# Patient Record
Sex: Male | Born: 1990 | Race: White | Hispanic: No | Marital: Married | Smoking: Never smoker
Health system: Southern US, Community
[De-identification: ages and names within clinical notes are randomized; demographics above are authoritative.]

## PROBLEM LIST (undated history)

## (undated) DIAGNOSIS — R519 Headache, unspecified: Secondary | ICD-10-CM

## (undated) DIAGNOSIS — Z789 Other specified health status: Secondary | ICD-10-CM

## (undated) DIAGNOSIS — F419 Anxiety disorder, unspecified: Secondary | ICD-10-CM

## (undated) DIAGNOSIS — F32A Depression, unspecified: Secondary | ICD-10-CM

## (undated) HISTORY — PX: NO PAST SURGERIES: SHX2092

---

## 2015-03-23 DIAGNOSIS — IMO0002 Reserved for concepts with insufficient information to code with codable children: Secondary | ICD-10-CM | POA: Insufficient documentation

## 2015-03-23 DIAGNOSIS — F5104 Psychophysiologic insomnia: Secondary | ICD-10-CM | POA: Insufficient documentation

## 2015-03-23 DIAGNOSIS — Z8659 Personal history of other mental and behavioral disorders: Secondary | ICD-10-CM | POA: Insufficient documentation

## 2019-09-16 DIAGNOSIS — F332 Major depressive disorder, recurrent severe without psychotic features: Secondary | ICD-10-CM | POA: Insufficient documentation

## 2019-09-16 DIAGNOSIS — F411 Generalized anxiety disorder: Secondary | ICD-10-CM | POA: Insufficient documentation

## 2019-10-27 DIAGNOSIS — E559 Vitamin D deficiency, unspecified: Secondary | ICD-10-CM | POA: Insufficient documentation

## 2019-12-25 DIAGNOSIS — F4001 Agoraphobia with panic disorder: Secondary | ICD-10-CM | POA: Diagnosis not present

## 2019-12-25 DIAGNOSIS — F5104 Psychophysiologic insomnia: Secondary | ICD-10-CM | POA: Diagnosis not present

## 2019-12-25 DIAGNOSIS — F411 Generalized anxiety disorder: Secondary | ICD-10-CM | POA: Diagnosis not present

## 2019-12-25 DIAGNOSIS — F332 Major depressive disorder, recurrent severe without psychotic features: Secondary | ICD-10-CM | POA: Diagnosis not present

## 2019-12-29 DIAGNOSIS — E559 Vitamin D deficiency, unspecified: Secondary | ICD-10-CM | POA: Diagnosis not present

## 2021-05-29 DIAGNOSIS — F5104 Psychophysiologic insomnia: Secondary | ICD-10-CM | POA: Diagnosis not present

## 2021-05-29 DIAGNOSIS — F411 Generalized anxiety disorder: Secondary | ICD-10-CM | POA: Diagnosis not present

## 2021-05-29 DIAGNOSIS — F3341 Major depressive disorder, recurrent, in partial remission: Secondary | ICD-10-CM | POA: Diagnosis not present

## 2021-11-24 ENCOUNTER — Ambulatory Visit: Payer: BC Managed Care – PPO | Admitting: Family Medicine

## 2021-11-24 VITALS — BP 127/86 | HR 82 | Temp 98.1°F | Ht 71.0 in | Wt 171.0 lb

## 2021-11-24 DIAGNOSIS — F332 Major depressive disorder, recurrent severe without psychotic features: Secondary | ICD-10-CM

## 2021-11-24 DIAGNOSIS — F411 Generalized anxiety disorder: Secondary | ICD-10-CM

## 2021-11-24 DIAGNOSIS — Z130 Encounter for screening for diseases of the blood and blood-forming organs and certain disorders involving the immune mechanism: Secondary | ICD-10-CM | POA: Diagnosis not present

## 2021-11-24 DIAGNOSIS — Z13220 Encounter for screening for lipoid disorders: Secondary | ICD-10-CM

## 2021-11-24 MED ORDER — BUSPIRONE HCL 15 MG PO TABS: 15.0000 mg | ORAL_TABLET | Freq: Two times a day (BID) | ORAL | 1 refills | Status: DC

## 2021-11-24 MED ORDER — DULOXETINE HCL 60 MG PO CPEP
120.0000 mg | ORAL_CAPSULE | Freq: Every day | ORAL | 1 refills | Status: DC
Start: 2021-11-24 — End: 2022-06-01

## 2021-11-24 NOTE — Progress Notes (Signed)
? ?Subjective:  ?Patient ID: Charles Key, male    DOB: Apr 03, 1991  Age: 31 y.o. MRN: [id number] ? ?CC: Establish care ? ?HPI ? ?31 year old male with a history of anxiety, depression, insomnia, and hypercalcemia presents to establish care. ? ?Patient states that he is on Cymbalta and BuSpar for anxiety and depression.  He states that he is doing well at this time.  Needs refills. ? ?Patient has a history of hypercalcemia and vitamin D deficiency.  Hypercalcemia thought to be secondary to hyperparathyroidism.  He has not had follow-up since 2021. ? ?He has no other complaints or concerns at this time. ? ? ?Patient Active Problem List  ? Diagnosis Date Noted  ? Hypercalcemia 12/29/2019  ? Vitamin D deficiency 10/27/2019  ? Generalized anxiety disorder 09/16/2019  ? Severe episode of recurrent major depressive disorder, without psychotic features ([place]) 09/16/2019  ? History of suicidal ideation 03/23/2015  ? History of intentional self-harm 03/23/2015  ? Psychophysiological insomnia 03/23/2015  ? ?Review of Systems  ?Constitutional: Negative.   ?Respiratory: Negative.    ?Cardiovascular: Negative.   ? ?Objective:  ? ?Today's Vitals: BP 127/86   Pulse 82   Temp 98.1 ?F (36.7 ?C) (Oral)   Ht 5' 11"  (1.803 m)   Wt 171 lb (77.6 kg)   SpO2 99%   BMI 23.85 kg/m?  ? ?Physical Exam ?Vitals and nursing note reviewed.  ?Constitutional:   ?   General: He is not in acute distress. ?   Appearance: Normal appearance.  ?HENT:  ?   Head: Normocephalic and atraumatic.  ?Eyes:  ?   General:     ?   Right eye: No discharge.     ?   Left eye: No discharge.  ?   Conjunctiva/sclera: Conjunctivae normal.  ?Cardiovascular:  ?   Rate and Rhythm: Normal rate and regular rhythm.  ?   Heart sounds: No murmur heard. ?Pulmonary:  ?   Effort: Pulmonary effort is normal.  ?   Breath sounds: No wheezing, rhonchi or rales.  ?Abdominal:  ?   General: There is no distension.  ?   Palpations: Abdomen is soft.  ?   Tenderness: There is no  abdominal tenderness.  ?Neurological:  ?   Mental Status: He is alert.  ?Psychiatric:     ?   Mood and Affect: Mood normal.     ?   Behavior: Behavior normal.  ? ? ? ?Assessment & Plan:  ? ?Problem List Items Addressed This Visit   ? ?  ? Other  ? Generalized anxiety disorder  ?  Stable at this time.  Continue BuSpar. ?  ?  ? Relevant Medications  ? DULoxetine (CYMBALTA) 60 MG capsule  ? busPIRone (BUSPAR) 15 MG tablet  ? Hypercalcemia  ?  Unsure of current status.  Labs today.  May need to see general surgery regarding removal of parathyroid glands if he indeed has hyperparathyroidism. ?  ?  ? Relevant Orders  ? CMP14+EGFR  ? Parathyroid hormone, intact (no Ca)  ? Vitamin D (25 hydroxy)  ? Severe episode of recurrent major depressive disorder, without psychotic features ([place])  ?  Stable at this time.  Continue Cymbalta. ?  ?  ? Relevant Medications  ? DULoxetine (CYMBALTA) 60 MG capsule  ? busPIRone (BUSPAR) 15 MG tablet  ? ?Other Visit Diagnoses   ? ? Screening for deficiency anemia    -  Primary  ? Relevant Orders  ? CBC  ? Screening, lipid      ?  Relevant Orders  ? Lipid panel  ? ?  ? ? ?Meds ordered this encounter  ?Medications  ? DULoxetine (CYMBALTA) 60 MG capsule  ?  Sig: Take 2 capsules (120 mg total) by mouth daily.  ?  Dispense:  180 capsule  ?  Refill:  1  ? busPIRone (BUSPAR) 15 MG tablet  ?  Sig: Take 1 tablet (15 mg total) by mouth 2 (two) times daily.  ?  Dispense:  180 tablet  ?  Refill:  1  ? ? ?Follow-up: Return in about 6 months (around 05/26/2022). ? ?Thersa Salt DO ?[place] ? ? ? ? ?

## 2021-11-24 NOTE — Patient Instructions (Signed)
Labs today. ? ?I have refilled your medications. ? ?Follow-up in 6 months. ?

## 2021-11-24 NOTE — Assessment & Plan Note (Signed)
Unsure of current status.  Labs today.  May need to see general surgery regarding removal of parathyroid glands if he indeed has hyperparathyroidism. ?

## 2021-11-24 NOTE — Assessment & Plan Note (Signed)
Stable at this time.  Continue Cymbalta. ?

## 2021-11-24 NOTE — Assessment & Plan Note (Signed)
Stable at this time.  Continue BuSpar. ?

## 2021-11-25 LAB — CBC
Hematocrit: 48.1 % (ref 37.5–51.0)
Hemoglobin: 15.8 g/dL (ref 13.0–17.7)
MCH: 29.5 pg (ref 26.6–33.0)
MCHC: 32.8 g/dL (ref 31.5–35.7)
MCV: 90 fL (ref 79–97)
Platelets: 244 10*3/uL (ref 150–450)
RBC: 5.35 x10E6/uL (ref 4.14–5.80)
RDW: 12.7 % (ref 11.6–15.4)
WBC: 7.8 10*3/uL (ref 3.4–10.8)

## 2021-11-25 LAB — VITAMIN D 25 HYDROXY (VIT D DEFICIENCY, FRACTURES): Vit D, 25-Hydroxy: 16.9 ng/mL — ABNORMAL LOW (ref 30.0–100.0)

## 2021-11-25 LAB — CMP14+EGFR
ALT: 69 IU/L — ABNORMAL HIGH (ref 0–44)
AST: 28 IU/L (ref 0–40)
Albumin/Globulin Ratio: 1.8 (ref 1.2–2.2)
Albumin: 4.7 g/dL (ref 4.1–5.2)
Alkaline Phosphatase: 78 IU/L (ref 44–121)
BUN/Creatinine Ratio: 21 — ABNORMAL HIGH (ref 9–20)
BUN: 21 mg/dL — ABNORMAL HIGH (ref 6–20)
Bilirubin Total: 0.2 mg/dL (ref 0.0–1.2)
CO2: 27 mmol/L (ref 20–29)
Calcium: 12.2 mg/dL — ABNORMAL HIGH (ref 8.7–10.2)
Chloride: 109 mmol/L — ABNORMAL HIGH (ref 96–106)
Creatinine, Ser: 1 mg/dL (ref 0.76–1.27)
Globulin, Total: 2.6 g/dL (ref 1.5–4.5)
Glucose: 74 mg/dL (ref 70–99)
Potassium: 4.5 mmol/L (ref 3.5–5.2)
Sodium: 149 mmol/L — ABNORMAL HIGH (ref 134–144)
Total Protein: 7.3 g/dL (ref 6.0–8.5)
eGFR: 104 mL/min/{1.73_m2} (ref >59)

## 2021-11-25 LAB — LIPID PANEL
Chol/HDL Ratio: 5.9 ratio — ABNORMAL HIGH (ref 0.0–5.0)
Cholesterol, Total: 282 mg/dL — ABNORMAL HIGH (ref 100–199)
HDL: 48 mg/dL (ref >39)
LDL Chol Calc (NIH): 204 mg/dL — ABNORMAL HIGH (ref 0–99)
Triglycerides: 160 mg/dL — ABNORMAL HIGH (ref 0–149)
VLDL Cholesterol Cal: 30 mg/dL (ref 5–40)

## 2021-11-25 LAB — PARATHYROID HORMONE, INTACT (NO CA): PTH: 89 pg/mL — ABNORMAL HIGH (ref 15–65)

## 2021-11-28 ENCOUNTER — Other Ambulatory Visit: Payer: Self-pay | Admitting: Family Medicine

## 2021-11-28 ENCOUNTER — Encounter: Payer: Self-pay | Admitting: Family Medicine

## 2021-11-28 MED ORDER — ROSUVASTATIN CALCIUM 20 MG PO TABS: 20.0000 mg | ORAL_TABLET | Freq: Every day | ORAL | 3 refills | Status: DC

## 2021-11-29 ENCOUNTER — Other Ambulatory Visit: Payer: Self-pay | Admitting: Family Medicine

## 2021-11-29 DIAGNOSIS — E213 Hyperparathyroidism, unspecified: Secondary | ICD-10-CM

## 2022-01-10 DIAGNOSIS — E559 Vitamin D deficiency, unspecified: Secondary | ICD-10-CM | POA: Diagnosis not present

## 2022-01-10 DIAGNOSIS — E210 Primary hyperparathyroidism: Secondary | ICD-10-CM | POA: Diagnosis not present

## 2022-01-16 ENCOUNTER — Other Ambulatory Visit: Payer: Self-pay | Admitting: Surgery

## 2022-01-16 ENCOUNTER — Other Ambulatory Visit (HOSPITAL_COMMUNITY): Payer: Self-pay | Admitting: Surgery

## 2022-01-17 ENCOUNTER — Other Ambulatory Visit (HOSPITAL_COMMUNITY): Payer: Self-pay | Admitting: Surgery

## 2022-01-17 ENCOUNTER — Other Ambulatory Visit: Payer: Self-pay | Admitting: Surgery

## 2022-01-17 DIAGNOSIS — E210 Primary hyperparathyroidism: Secondary | ICD-10-CM

## 2022-01-26 ENCOUNTER — Encounter (HOSPITAL_COMMUNITY)
Admission: RE | Admit: 2022-01-26 | Discharge: 2022-01-26 | Disposition: A | Discharge status: Home / Self Care | Payer: PRIVATE HEALTH INSURANCE | Source: Ambulatory Visit | Attending: Surgery | Admitting: Surgery

## 2022-01-26 DIAGNOSIS — E210 Primary hyperparathyroidism: Secondary | ICD-10-CM

## 2022-01-26 MED ORDER — TECHNETIUM TC 99M SESTAMIBI GENERIC - CARDIOLITE
25.1000 | Freq: Once | INTRAVENOUS | Status: AC | PRN
  Administered 2022-01-26: 25.1 via INTRAVENOUS

## 2022-01-29 NOTE — Progress Notes (Signed)
USN does NOT show an adenoma.  Sestamibi scan is questionable for a left superior gland.  Will schedule a 4D-CT scan with parathyroid protocol as we discussed in the office.  Claiborne Billings - please arrange.  [place], MD [facility] A [place] practice Office: [phone]

## 2022-02-06 ENCOUNTER — Other Ambulatory Visit: Payer: Self-pay | Admitting: Surgery

## 2022-02-06 DIAGNOSIS — E0590 Thyrotoxicosis, unspecified without thyrotoxic crisis or storm: Secondary | ICD-10-CM

## 2022-03-07 ENCOUNTER — Ambulatory Visit
Admission: RE | Admit: 2022-03-07 | Discharge: 2022-03-07 | Disposition: A | Discharge status: Home / Self Care | Payer: Self-pay | Source: Ambulatory Visit | Attending: Surgery | Admitting: Surgery

## 2022-03-07 DIAGNOSIS — E0590 Thyrotoxicosis, unspecified without thyrotoxic crisis or storm: Secondary | ICD-10-CM

## 2022-03-07 MED ORDER — IOPAMIDOL (ISOVUE-370) INJECTION 76%
75.0000 mL | Freq: Once | INTRAVENOUS | Status: AC | PRN
  Administered 2022-03-07: 75 mL via INTRAVENOUS

## 2022-03-14 ENCOUNTER — Ambulatory Visit: Payer: Self-pay | Admitting: Surgery

## 2022-03-14 NOTE — Progress Notes (Signed)
Telephone call to patient with results of USN, sestamibi, and CT scan.  Plan to proceed with neck exploration and parathyroidectomy.  Suspect left superior adenoma, but will check all four glands.  Anticipate outpatient surgery.  Armandina Gemma, MD [facility] Surgery A [place] practice Office: [phone]

## 2022-03-28 NOTE — Progress Notes (Addendum)
COVID Vaccine received:  '[]'$  No '[x]'$  Yes Date of any COVID positive Test in last 90 days: none  PCP - Thersa Salt, DO Cardiologist - none  Chest x-ray - n/a EKG -  n/a Stress Test - n/a ECHO - n/a Cardiac Cath - n/a  Pacemaker/ICD device     '[x]'$  N/A Spinal Cord Stimulator:'[x]'$  No '[]'$  Yes   Other Implants:   History of Sleep Apnea? '[x]'$  No '[]'$  Yes   Sleep Study Date:   CPAP used?- '[x]'$  No '[]'$  Yes  (Instruct to bring their mask & Tubing)  Does the patient monitor blood sugar? '[]'$  No '[]'$  Yes  '[x]'$  N/A  Blood Thinner Instructions: none Aspirin Instructions:None Last Dose:  ERAS Protocol Ordered: '[]'$  No  '[x]'$  Yes  no Drink Ordered  Comments: patient did not need any preop labs or testing.   Activity level: Patient can climb a flight of stairs without difficulty;  '[x]'$  No CP  '[x]'$  No SOB,  but would have ______   Anesthesia review: No pertinent medical or surgical history. Never had anesthesia.   Patient denies shortness of breath, fever, cough and chest pain at PAT appointment.  Patient verbalized understanding and agreement to the Pre-Surgical Instructions that were given to them at this PAT appointment. Patient was also educated of the need to review these PAT instructions again prior to his/her surgery.I reviewed the appropriate phone numbers to call if they have any and questions or concerns.

## 2022-03-28 NOTE — Patient Instructions (Addendum)
DUE TO SPACE LIMITATIONS, ONLY TWO VISITORS  (aged 31 and older) ARE ALLOWED TO COME WITH YOU AND STAY IN THE WAITING ROOM DURING YOUR PRE OP AND PROCEDURE.   **NO VISITORS ARE ALLOWED IN THE SHORT STAY AREA OR RECOVERY ROOM!!**  You are not required to quarantine at this time prior to your surgery. However, you must do this: Hand Hygiene often Do NOT share personal items Notify your provider if you are in close contact with someone who has COVID or you develop fever 100.4 or greater, new onset of sneezing, cough, sore throat, shortness of breath or body aches.       Your procedure is scheduled on:  Monday April 02, 2022  Report to [facility] Main Entrance.  Report to admitting at: 05:15 AM  +++++Call this number if you have any questions or problems the morning of surgery [phone]  Do not eat food :After Midnight the night prior to your surgery/procedure.  After Midnight you may have the following liquids until   04:30 AM DAY OF SURGERY  Clear Liquid Diet Water Black Coffee (sugar ok, NO MILK/CREAM OR CREAMERS)  Tea (sugar ok, NO MILK/CREAM OR CREAMERS) regular and decaf                             Plain Jell-O (NO RED)                                           Fruit ices (not with fruit pulp, NO RED)                                     Popsicles (NO RED)                                                                  Juice: apple, WHITE grape, WHITE cranberry Sports drinks like Gatorade (NO RED)             FOLLOW ANY ADDITIONAL PRE OP INSTRUCTIONS YOU RECEIVED FROM YOUR SURGEON'S OFFICE!!!   Oral Hygiene is also important to reduce your risk of infection.        Remember - BRUSH YOUR TEETH THE MORNING OF SURGERY WITH YOUR REGULAR TOOTHPASTE  Take ONLY these medicines the morning of surgery with A SIP OF WATER: buspirone (Buspar), Duloxetine (Cymbalta)                    You may not have any metal on your body including pins, jewelry, and body piercing  Do  not wear lotions, powders, cologne, or deodorant  Men may shave face and neck.   DO NOT [place]. PHARMACY WILL DISPENSE MEDICATIONS LISTED ON YOUR MEDICATION LIST TO YOU DURING YOUR ADMISSION [place]!   Patients discharged on the day of surgery will not be allowed to drive home.  Someone NEEDS to stay with you for the first 24 hours after anesthesia.  Special Instructions: Bring a copy of your healthcare power of attorney  and living will documents the day of surgery, if you wish to have them scanned into your [place] Medical Records- EPIC  Please read over the following fact sheets you were given: IF YOU HAVE QUESTIONS ABOUT YOUR PRE-OP INSTRUCTIONS, PLEASE CALL [phone]  ([place])   [place] - Preparing for Surgery Before surgery, you can play an important role.  Because skin is not sterile, your skin needs to be as free of germs as possible.  You can reduce the number of germs on your skin by washing with CHG (chlorahexidine gluconate) soap before surgery.  CHG is an antiseptic cleaner which kills germs and bonds with the skin to continue killing germs even after washing. Please DO NOT use if you have an allergy to CHG or antibacterial soaps.  If your skin becomes reddened/irritated stop using the CHG and inform your nurse when you arrive at Short Stay. Do not shave (including legs and underarms) for at least 48 hours prior to the first CHG shower.  You may shave your face/neck.  Please follow these instructions carefully:  1.  Shower with CHG Soap the night before surgery and the  morning of surgery.  2.  If you choose to wash your hair, wash your hair first as usual with your normal  shampoo.  3.  After you shampoo, rinse your hair and body thoroughly to remove the shampoo.                             4.  Use CHG as you would any other liquid soap.  You can apply chg directly to the skin and wash.  Gently with a scrungie or clean  washcloth.  5.  Apply the CHG Soap to your body ONLY FROM THE NECK DOWN.   Do not use on face/ open                           Wound or open sores. Avoid contact with eyes, ears mouth and genitals (private parts).                       Wash face,  Genitals (private parts) with your normal soap.             6.  Wash thoroughly, paying special attention to the area where your  surgery  will be performed.  7.  Thoroughly rinse your body with warm water from the neck down.  8.  DO NOT shower/wash with your normal soap after using and rinsing off the CHG Soap.            9.  Pat yourself dry with a clean towel.            10.  Wear clean pajamas.            11.  Place clean sheets on your bed the night of your first shower and do not  sleep with pets.  ON THE DAY OF SURGERY : Do not apply any lotions/deodorants the morning of surgery.  Please wear clean clothes to the hospital/surgery center.    FAILURE TO FOLLOW THESE INSTRUCTIONS MAY RESULT IN THE CANCELLATION OF YOUR SURGERY  PATIENT SIGNATURE_________________________________  NURSE SIGNATURE__________________________________  ________________________________________________________________________

## 2022-03-30 ENCOUNTER — Encounter (HOSPITAL_COMMUNITY): Payer: Self-pay

## 2022-03-30 ENCOUNTER — Other Ambulatory Visit: Payer: Self-pay

## 2022-03-30 ENCOUNTER — Encounter (HOSPITAL_COMMUNITY)
Admission: RE | Admit: 2022-03-30 | Discharge: 2022-03-30 | Disposition: A | Discharge status: Home / Self Care | Payer: 59 | Source: Ambulatory Visit | Attending: Surgery | Admitting: Surgery

## 2022-03-30 DIAGNOSIS — Z01818 Encounter for other preprocedural examination: Secondary | ICD-10-CM | POA: Insufficient documentation

## 2022-03-30 HISTORY — DX: Depression, unspecified: F32.A

## 2022-03-30 HISTORY — DX: Anxiety disorder, unspecified: F41.9

## 2022-03-30 HISTORY — DX: Headache, unspecified: R51.9

## 2022-03-30 HISTORY — DX: Other specified health status: Z78.9

## 2022-04-01 ENCOUNTER — Encounter (HOSPITAL_COMMUNITY): Payer: Self-pay | Admitting: Surgery

## 2022-04-01 DIAGNOSIS — E210 Primary hyperparathyroidism: Secondary | ICD-10-CM | POA: Diagnosis present

## 2022-04-01 NOTE — H&P (Signed)
REFERRING PHYSICIAN: Coral Spikes, DO  PROVIDER: Jatorian Renault Charlotta Newton, MD   Chief Complaint: New Consultation (hyperparathyroidism)  History of Present Illness:  Patient is referred by Dr. Thersa Salt for surgical evaluation and management of suspected primary hyperparathyroidism. Patient has had a history of hypercalcemia. This was initially evaluated by a physician in [place], [place]. Patient has changed primary care providers and is now seeing Dr. Lacinda Axon. He has not had any imaging studies performed. Recent laboratory studies show an elevated serum calcium level of 12.2. Intact PTH level is elevated at 89. 25 hydroxy vitamin D level is low at 16.9. Patient has not had a 24-hour urine collection for calcium. Patient denies any significant complications. He does have some bone and joint discomfort. He has some low back pain. He denies nephrolithiasis. He denies fatigue. Patient has had no prior head or neck surgery. There is no family history of parathyroid disease although the patient does think that one of his sisters has had some problems, possibly with her thyroid. None of his family has required surgery. Patient works in Copy at [employer].  Review of Systems: A complete review of systems was obtained from the patient. I have reviewed this information and discussed as appropriate with the patient. See HPI as well for other ROS.  Review of Systems  Constitutional: Negative.  HENT: Negative.  Eyes: Negative.  Respiratory: Negative.  Cardiovascular: Negative.  Gastrointestinal: Negative.  Genitourinary: Negative.  Musculoskeletal: Positive for back pain and joint pain.  Skin: Negative.  Neurological: Negative.  Endo/Heme/Allergies: Negative.  Psychiatric/Behavioral: Negative.    Medical History: Past Medical History:  Diagnosis Date   Anxiety   Hyperlipidemia   Patient Active Problem List  Diagnosis   Primary hyperparathyroidism (CMS-HCC)   Vitamin D  deficiency   History reviewed. No pertinent surgical history.   No Known Allergies  Current Outpatient Medications on File Prior to Visit  Medication Sig Dispense Refill   busPIRone (BUSPAR) 15 MG tablet Take 15 mg by mouth 2 (two) times daily   DULoxetine (CYMBALTA) 60 MG DR capsule   rosuvastatin (CRESTOR) 20 MG tablet Take 20 mg by mouth once daily   No current facility-administered medications on file prior to visit.   Family History  Problem Relation Age of Onset   High blood pressure (Hypertension) Mother   Stroke Mother   Hyperlipidemia (Elevated cholesterol) Father   Diabetes Father    Social History   Tobacco Use  Smoking Status Never  Smokeless Tobacco Never    Social History   Socioeconomic History   Marital status: Married  Tobacco Use   Smoking status: Never   Smokeless tobacco: Never  Substance and Sexual Activity   Alcohol use: Never   Drug use: Never   Objective:   Vitals:   BP: 136/80  Pulse: 87  Temp: 36.3 C (97.3 F)  SpO2: 98%  Weight: 79.5 kg (175 lb 3.2 oz)  Height: 177.8 cm ('5\' 10"'$ )   Body mass index is 25.14 kg/m.  Physical Exam   GENERAL APPEARANCE Comfortable, no acute issues Development: normal Gross deformities: none  SKIN Rash, lesions, ulcers: none Induration, erythema: none Nodules: none palpable  EYES Conjunctiva and lids: normal Pupils: equal and reactive  EARS, NOSE, MOUTH, THROAT External ears: no lesion or deformity External nose: no lesion or deformity Hearing: grossly normal  NECK Symmetric: yes Trachea: midline Thyroid: no palpable nodules in the thyroid bed  CHEST Respiratory effort: normal Retraction or accessory  muscle use: no Breath sounds: normal bilaterally Rales, rhonchi, wheeze: none  CARDIOVASCULAR Auscultation: regular rhythm, normal rate Murmurs: none Pulses: radial pulse 2+ palpable Lower extremity edema: none  ABDOMEN Not assessed  GENITOURINARY Not  assessed  MUSCULOSKELETAL Station and gait: normal Digits and nails: no clubbing or cyanosis Muscle strength: grossly normal all extremities Range of motion: grossly normal all extremities Deformity: none  LYMPHATIC Cervical: none palpable Supraclavicular: none palpable  PSYCHIATRIC Oriented to person, place, and time: yes Mood and affect: normal for situation Judgment and insight: appropriate for situation   Assessment and Plan:   Primary hyperparathyroidism (CMS-HCC)  Vitamin D deficiency  Patient is referred by his primary care physician for surgical evaluation and management of suspected primary hyperparathyroidism.  Patient provided with a copy of "Parathyroid Surgery: Treatment for Your Parathyroid Gland Problem", published by [employer], 12 pages. Book reviewed and explained to patient during visit today.  The patient has biochemical evidence of primary hyperparathyroidism. I would like to proceed with imaging studies to include an ultrasound examination of the neck as well as a nuclear medicine parathyroid scan. We will also obtain a 24-hour urine collection for calcium. Hopefully the studies will localize a parathyroid adenoma and make the patient a good candidate for minimally invasive surgery. If the studies failed to confirm the location of a parathyroid adenoma, then we will proceed with a 4D CT scan of the neck in hopes of localizing the adenoma.  Today we discussed minimally invasive parathyroid surgery. We discussed the size and location of the surgical incision. We discussed performing this as an outpatient surgical procedure. We discussed time out of work. I provided the patient with written literature on parathyroid surgery to review at home.  Patient will undergo the above imaging studies as well as a 24-hour urine collection for calcium. We will contact him with the results when they are available. We will make plans for further management once the results of his  studies are available.  Armandina Gemma, MD [facility] A [place] practice Office: [phone]

## 2022-04-02 ENCOUNTER — Ambulatory Visit (HOSPITAL_BASED_OUTPATIENT_CLINIC_OR_DEPARTMENT_OTHER): Payer: 59 | Admitting: Anesthesiology

## 2022-04-02 ENCOUNTER — Other Ambulatory Visit: Payer: Self-pay

## 2022-04-02 ENCOUNTER — Ambulatory Visit (HOSPITAL_COMMUNITY)
Admission: RE | Admit: 2022-04-02 | Discharge: 2022-04-02 | Disposition: A | Discharge status: Home / Self Care | Payer: 59 | Source: Ambulatory Visit | Attending: Surgery | Admitting: Surgery

## 2022-04-02 ENCOUNTER — Encounter (HOSPITAL_COMMUNITY)
Admission: RE | Disposition: A | Discharge status: Home / Self Care | Payer: Self-pay | Source: Ambulatory Visit | Attending: Surgery

## 2022-04-02 ENCOUNTER — Encounter (HOSPITAL_COMMUNITY): Payer: Self-pay | Admitting: Surgery

## 2022-04-02 ENCOUNTER — Ambulatory Visit (HOSPITAL_COMMUNITY): Payer: 59 | Admitting: Anesthesiology

## 2022-04-02 DIAGNOSIS — F418 Other specified anxiety disorders: Secondary | ICD-10-CM | POA: Insufficient documentation

## 2022-04-02 DIAGNOSIS — E210 Primary hyperparathyroidism: Secondary | ICD-10-CM

## 2022-04-02 DIAGNOSIS — E0590 Thyrotoxicosis, unspecified without thyrotoxic crisis or storm: Secondary | ICD-10-CM | POA: Insufficient documentation

## 2022-04-02 DIAGNOSIS — D351 Benign neoplasm of parathyroid gland: Secondary | ICD-10-CM | POA: Insufficient documentation

## 2022-04-02 HISTORY — PX: PARATHYROIDECTOMY: SHX19

## 2022-04-02 SURGERY — PARATHYROIDECTOMY
Anesthesia: General

## 2022-04-02 MED ORDER — CHLORHEXIDINE GLUCONATE 0.12 % MT SOLN
15.0000 mL | Freq: Once | OROMUCOSAL | Status: AC
  Administered 2022-04-02: 15 mL via OROMUCOSAL

## 2022-04-02 MED ORDER — TRAMADOL HCL 50 MG PO TABS: 50.0000 mg | ORAL_TABLET | Freq: Four times a day (QID) | ORAL | 0 refills | Status: DC | PRN

## 2022-04-02 MED ORDER — DIPHENHYDRAMINE HCL 50 MG/ML IJ SOLN
INTRAMUSCULAR | Status: AC
  Filled 2022-04-02: qty 1

## 2022-04-02 MED ORDER — MIDAZOLAM HCL 5 MG/5ML IJ SOLN
INTRAMUSCULAR | Status: DC | PRN
  Administered 2022-04-02: 2 mg via INTRAVENOUS

## 2022-04-02 MED ORDER — DEXAMETHASONE SODIUM PHOSPHATE 10 MG/ML IJ SOLN
INTRAMUSCULAR | Status: DC | PRN
  Administered 2022-04-02: 8 mg via INTRAVENOUS

## 2022-04-02 MED ORDER — PROPOFOL 10 MG/ML IV BOLUS
INTRAVENOUS | Status: DC | PRN
  Administered 2022-04-02: 150 mg via INTRAVENOUS
  Administered 2022-04-02: 50 mg via INTRAVENOUS

## 2022-04-02 MED ORDER — FENTANYL CITRATE (PF) 100 MCG/2ML IJ SOLN
INTRAMUSCULAR | Status: DC | PRN
  Administered 2022-04-02: 50 ug via INTRAVENOUS
  Administered 2022-04-02 (×2): 100 ug via INTRAVENOUS

## 2022-04-02 MED ORDER — ACETAMINOPHEN 325 MG PO TABS: 325.0000 mg | ORAL_TABLET | ORAL | Status: DC | PRN

## 2022-04-02 MED ORDER — PROPOFOL 10 MG/ML IV BOLUS
INTRAVENOUS | Status: AC
  Filled 2022-04-02: qty 20

## 2022-04-02 MED ORDER — ACETAMINOPHEN 160 MG/5ML PO SOLN: 325.0000 mg | ORAL | Status: DC | PRN

## 2022-04-02 MED ORDER — MIDAZOLAM HCL 2 MG/2ML IJ SOLN
INTRAMUSCULAR | Status: AC
  Filled 2022-04-02: qty 2

## 2022-04-02 MED ORDER — CHLORHEXIDINE GLUCONATE CLOTH 2 % EX PADS: 6.0000 | MEDICATED_PAD | Freq: Once | CUTANEOUS | Status: DC

## 2022-04-02 MED ORDER — AMISULPRIDE (ANTIEMETIC) 5 MG/2ML IV SOLN: 10.0000 mg | Freq: Once | INTRAVENOUS | Status: DC | PRN

## 2022-04-02 MED ORDER — 0.9 % SODIUM CHLORIDE (POUR BTL) OPTIME
TOPICAL | Status: DC | PRN
  Administered 2022-04-02: 1000 mL

## 2022-04-02 MED ORDER — BUPIVACAINE HCL (PF) 0.5 % IJ SOLN
INTRAMUSCULAR | Status: AC
  Filled 2022-04-02: qty 30

## 2022-04-02 MED ORDER — DEXAMETHASONE SODIUM PHOSPHATE 10 MG/ML IJ SOLN
INTRAMUSCULAR | Status: AC
  Filled 2022-04-02: qty 1

## 2022-04-02 MED ORDER — ONDANSETRON HCL 4 MG/2ML IJ SOLN
INTRAMUSCULAR | Status: AC
  Filled 2022-04-02: qty 2

## 2022-04-02 MED ORDER — DIPHENHYDRAMINE HCL 50 MG/ML IJ SOLN
INTRAMUSCULAR | Status: DC | PRN
  Administered 2022-04-02: 6.25 mg via INTRAVENOUS

## 2022-04-02 MED ORDER — FENTANYL CITRATE PF 50 MCG/ML IJ SOSY: 25.0000 ug | PREFILLED_SYRINGE | INTRAMUSCULAR | Status: DC | PRN

## 2022-04-02 MED ORDER — ROCURONIUM BROMIDE 100 MG/10ML IV SOLN
INTRAVENOUS | Status: DC | PRN
  Administered 2022-04-02: 60 mg via INTRAVENOUS

## 2022-04-02 MED ORDER — DEXMEDETOMIDINE (PRECEDEX) IN NS 20 MCG/5ML (4 MCG/ML) IV SYRINGE
PREFILLED_SYRINGE | INTRAVENOUS | Status: DC | PRN
  Administered 2022-04-02: 20 ug via INTRAVENOUS

## 2022-04-02 MED ORDER — ORAL CARE MOUTH RINSE: 15.0000 mL | Freq: Once | OROMUCOSAL | Status: AC

## 2022-04-02 MED ORDER — ROCURONIUM BROMIDE 10 MG/ML (PF) SYRINGE
PREFILLED_SYRINGE | INTRAVENOUS | Status: AC
  Filled 2022-04-02: qty 10

## 2022-04-02 MED ORDER — CEFAZOLIN SODIUM-DEXTROSE 2-4 GM/100ML-% IV SOLN
2.0000 g | INTRAVENOUS | Status: AC
  Administered 2022-04-02: 2 g via INTRAVENOUS
  Filled 2022-04-02: qty 100

## 2022-04-02 MED ORDER — ONDANSETRON HCL 4 MG/2ML IJ SOLN
INTRAMUSCULAR | Status: DC | PRN
  Administered 2022-04-02: 4 mg via INTRAVENOUS

## 2022-04-02 MED ORDER — BUPIVACAINE HCL 0.5 % IJ SOLN
INTRAMUSCULAR | Status: DC | PRN
  Administered 2022-04-02: 10 mL

## 2022-04-02 MED ORDER — ACETAMINOPHEN 10 MG/ML IV SOLN: 1000.0000 mg | Freq: Once | INTRAVENOUS | Status: DC | PRN

## 2022-04-02 MED ORDER — OXYCODONE HCL 5 MG PO TABS
ORAL_TABLET | ORAL | Status: AC
  Filled 2022-04-02: qty 1

## 2022-04-02 MED ORDER — OXYCODONE HCL 5 MG/5ML PO SOLN: 5.0000 mg | Freq: Once | ORAL | Status: DC | PRN

## 2022-04-02 MED ORDER — LIDOCAINE 2% (20 MG/ML) 5 ML SYRINGE
INTRAMUSCULAR | Status: AC
  Filled 2022-04-02: qty 5

## 2022-04-02 MED ORDER — OXYCODONE HCL 5 MG PO TABS: 5.0000 mg | ORAL_TABLET | Freq: Once | ORAL | Status: DC | PRN

## 2022-04-02 MED ORDER — HEMOSTATIC AGENTS (NO CHARGE) OPTIME
TOPICAL | Status: DC | PRN
  Administered 2022-04-02: 1

## 2022-04-02 MED ORDER — KETAMINE HCL 10 MG/ML IJ SOLN
INTRAMUSCULAR | Status: AC
Start: 2022-04-02 — End: ?
  Filled 2022-04-02: qty 1

## 2022-04-02 MED ORDER — FENTANYL CITRATE (PF) 250 MCG/5ML IJ SOLN
INTRAMUSCULAR | Status: AC
  Filled 2022-04-02: qty 5

## 2022-04-02 MED ORDER — PROMETHAZINE HCL 25 MG/ML IJ SOLN: 6.2500 mg | INTRAMUSCULAR | Status: DC | PRN

## 2022-04-02 MED ORDER — SUGAMMADEX SODIUM 500 MG/5ML IV SOLN
INTRAVENOUS | Status: DC | PRN
  Administered 2022-04-02: 200 mg via INTRAVENOUS

## 2022-04-02 MED ORDER — KETAMINE HCL 10 MG/ML IJ SOLN
INTRAMUSCULAR | Status: DC | PRN
  Administered 2022-04-02: 30 mg via INTRAVENOUS

## 2022-04-02 MED ORDER — LACTATED RINGERS IV SOLN: INTRAVENOUS | Status: DC

## 2022-04-02 MED ORDER — SODIUM CHLORIDE (PF) 0.9 % IJ SOLN
INTRAMUSCULAR | Status: AC
  Filled 2022-04-02: qty 10

## 2022-04-02 MED ORDER — LIDOCAINE HCL (CARDIAC) PF 100 MG/5ML IV SOSY
PREFILLED_SYRINGE | INTRAVENOUS | Status: DC | PRN
  Administered 2022-04-02: 60 mg via INTRAVENOUS

## 2022-04-02 MED ORDER — FENTANYL CITRATE PF 50 MCG/ML IJ SOSY
PREFILLED_SYRINGE | INTRAMUSCULAR | Status: AC
  Administered 2022-04-02: 50 ug via INTRAVENOUS
  Filled 2022-04-02: qty 2

## 2022-04-02 SURGICAL SUPPLY — 33 items
ATTRACTOMAT 16X20 MAGNETIC DRP (DRAPES) ×1 IMPLANT
BAG COUNTER SPONGE SURGICOUNT (BAG) ×1 IMPLANT
BLADE SURG 15 STRL LF DISP TIS (BLADE) ×1 IMPLANT
BLADE SURG 15 STRL SS (BLADE) ×1
CHLORAPREP W/TINT 26 (MISCELLANEOUS) ×1 IMPLANT
CLIP TI MEDIUM 6 (CLIP) ×2 IMPLANT
CLIP TI WIDE RED SMALL 6 (CLIP) ×2 IMPLANT
COVER SURGICAL LIGHT HANDLE (MISCELLANEOUS) ×1 IMPLANT
DERMABOND IMPLANT
DERMABOND ADVANCED (GAUZE/BANDAGES/DRESSINGS) ×1
DERMABOND ADVANCED .7 DNX12 (GAUZE/BANDAGES/DRESSINGS) ×1 IMPLANT
DRAPE LAPAROTOMY T 98X78 PEDS (DRAPES) ×1 IMPLANT
DRAPE UTILITY XL STRL (DRAPES) ×1 IMPLANT
ELECT REM PT RETURN 15FT ADLT (MISCELLANEOUS) ×1 IMPLANT
GAUZE 4X4 16PLY ~~LOC~~+RFID DBL (SPONGE) ×1 IMPLANT
GLOVE SURG ORTHO 8.0 STRL STRW (GLOVE) ×1 IMPLANT
GOWN STRL REUS W/ TWL XL LVL3 (GOWN DISPOSABLE) ×3 IMPLANT
GOWN STRL REUS W/TWL XL LVL3 (GOWN DISPOSABLE) ×4
HEMOSTAT SURGICEL 2X4 FIBR (HEMOSTASIS) ×1 IMPLANT
ILLUMINATOR WAVEGUIDE N/F (MISCELLANEOUS) IMPLANT
KIT BASIN OR (CUSTOM PROCEDURE TRAY) ×1 IMPLANT
KIT TURNOVER KIT A (KITS) IMPLANT
NDL HYPO 25X1 1.5 SAFETY (NEEDLE) ×1 IMPLANT
NEEDLE HYPO 25X1 1.5 SAFETY (NEEDLE) ×1 IMPLANT
PACK BASIC VI WITH GOWN DISP (CUSTOM PROCEDURE TRAY) ×1 IMPLANT
PENCIL SMOKE EVACUATOR (MISCELLANEOUS) ×1 IMPLANT
SUT MNCRL AB 4-0 PS2 18 (SUTURE) ×1 IMPLANT
SUT VIC AB 3-0 SH 18 (SUTURE) ×1 IMPLANT
SYR BULB IRRIG 60ML STRL (SYRINGE) ×1 IMPLANT
SYR CONTROL 10ML LL (SYRINGE) ×1 IMPLANT
TOWEL OR 17X26 10 PK STRL BLUE (TOWEL DISPOSABLE) ×1 IMPLANT
TOWEL OR NON WOVEN STRL DISP B (DISPOSABLE) ×1 IMPLANT
TUBING CONNECTING 10 (TUBING) ×1 IMPLANT

## 2022-04-02 NOTE — Interval H&P Note (Signed)
History and Physical Interval Note:  04/02/2022 7:02 AM  Charles Key  has presented today for surgery, with the diagnosis of PRIMARY HYPERTHYROIDISM.  The various methods of treatment have been discussed with the patient and family. After consideration of risks, benefits and other options for treatment, the patient has consented to    Procedure(s): NECK EXPLORATION WITH PARATHYROIDECTOMY (N/A) as a surgical intervention.    The patient's history has been reviewed, patient examined, no change in status, stable for surgery.  I have reviewed the patient's chart and labs.  Questions were answered to the patient's satisfaction.    Armandina Gemma, [place] [facility] A [place] practice Office:  [place]

## 2022-04-02 NOTE — Anesthesia Procedure Notes (Signed)
Procedure Name: Intubation Date/Time: 04/02/2022 7:34 AM  Performed by: Jonna Munro, CRNAPre-anesthesia Checklist: Patient identified, Emergency Drugs available, Suction available, Patient being monitored and Timeout performed Patient Re-evaluated:Patient Re-evaluated prior to induction Oxygen Delivery Method: Circle system utilized Induction Type: IV induction Ventilation: Mask ventilation without difficulty Laryngoscope Size: Mac and 3 Grade View: Grade I Tube type: Oral Tube size: 7.5 mm Number of attempts: 1 Airway Equipment and Method: Stylet Placement Confirmation: ETT inserted through vocal cords under direct vision, positive ETCO2, CO2 detector and breath sounds checked- equal and bilateral Secured at: 22 cm Tube secured with: Tape Dental Injury: Teeth and Oropharynx as per pre-operative assessment

## 2022-04-02 NOTE — Discharge Instructions (Addendum)
[facility] [place] [facility] - Dr. Todd Gerkin  THYROID & PARATHYROID SURGERY:  POST-OP INSTRUCTIONS  Always review the instruction sheet provided by the hospital nurse at discharge.  A prescription for pain medication may be sent to your pharmacy at the time of discharge.  Take your pain medication as prescribed.  If narcotic pain medicine is not needed, then you may take acetaminophen (Tylenol) or ibuprofen (Advil) as needed for pain or soreness.  Take your normal home medications as prescribed unless otherwise directed.  If you need a refill on your pain medication, please contact the office during regular business hours.  Prescriptions will not be processed by the office after 5:00PM or on weekends.  Start with a light diet upon arrival home, such as soup and crackers or toast.  Be sure to drink plenty of fluids.  Resume your normal diet the day after surgery.  Most patients will experience some swelling and bruising on the chest and neck area.  Ice packs will help for the first 48 hours after arriving home.  Swelling and bruising will take several days to resolve.   It is common to experience some constipation after surgery.  Increasing fluid intake and taking a stool softener (Colace) will usually help to prevent this problem.  A mild laxative (Milk of Magnesia or Miralax) should be taken according to package directions if there has been no bowel movement after 48 hours.  Dermabond glue covers your incision. This seals the wound and you may shower at any time. The Dermabond will remain in place for about a week.  You may gradually remove the glue when it loosens around the edges.  If you need to loosen the Dermabond for removal, apply a layer of Vaseline to the wound for 15 minutes and then remove with a Kleenex. Your sutures are under the skin and will not show - they will dissolve on their own.  You may resume light daily activities beginning the day after discharge (such as self-care,  walking, climbing stairs), gradually increasing activities as tolerated. You may have sexual intercourse when it is comfortable. Refrain from any heavy lifting or straining until approved by your doctor. You may drive when you no longer are taking prescription pain medication, you can comfortably wear a seatbelt, and you can safely maneuver your car and apply the brakes.  You will see your doctor in the office for a follow-up appointment approximately three weeks after your surgery.  Make sure that you call for this appointment within a day or two after you arrive home to insure a convenient appointment time. Please have any requested laboratory tests performed a few days prior to your office visit so that the results will be available at your follow up appointment.  WHEN TO CALL THE [facility] OFFICE: -- Fever greater than 101.5 -- Inability to urinate -- Nausea and/or vomiting - persistent -- Extreme swelling or bruising -- Continued bleeding from incision -- Increased pain, redness, or drainage from the incision -- Difficulty swallowing or breathing -- Muscle cramping or spasms -- Numbness or tingling in hands or around lips  The clinic staff is available to answer your questions during regular business hours.  Please don't hesitate to call and ask to speak to one of the nurses if you have concerns.  [facility] OFFICE: [phone] (24 hours)  Please sign up for MyChart accounts. This will allow you to communicate directly with my nurse or myself without having to call the office. It will also allow you   to view your test results. You will need to enroll in MyChart for my office ([facility]) and for the hospital ([place] [facility]).  Todd Gerkin, MD [facility] [place] [facility] practice 

## 2022-04-02 NOTE — Anesthesia Postprocedure Evaluation (Signed)
Anesthesia Post Note  Patient: Charles Key  Procedure(s) Performed: NECK EXPLORATION WITH PARATHYROIDECTOMY     Patient location during evaluation: PACU Anesthesia Type: General Level of consciousness: awake and alert Pain management: pain level controlled Vital Signs Assessment: post-procedure vital signs reviewed and stable Respiratory status: spontaneous breathing, nonlabored ventilation, respiratory function stable and patient connected to nasal cannula oxygen Cardiovascular status: blood pressure returned to baseline and stable Postop Assessment: no apparent nausea or vomiting Anesthetic complications: no   No notable events documented.  Last Vitals:  Vitals:   04/02/22 1011 04/02/22 1015  BP: (!) 160/101 (!) 154/101  Pulse: (!) 111 (!) 101  Resp: 16   Temp: 36.7 C   SpO2: 100% 98%    Last Pain:  Vitals:   04/02/22 1011  TempSrc:   PainSc: 2                  Effie Berkshire

## 2022-04-02 NOTE — Op Note (Signed)
Operative Note  Pre-operative Diagnosis:  primary hyperparathyroidism  Post-operative Diagnosis:  same  Surgeon:  Armandina Gemma, MD  Assistant:  Carlena Hurl, PA-C   Procedure:  Neck exploration, left superior parathyroidectomy  Anesthesia:  general  Estimated Blood Loss:  minimal  Drains: none         Specimen: left superior gland to pathology  Indications:  Patient is referred by Dr. Thersa Salt for surgical evaluation and management of suspected primary hyperparathyroidism. Patient has had a history of hypercalcemia. This was initially evaluated by a physician in [place]. Patient has changed primary care providers and is now seeing Dr. Lacinda Axon. He has not had any imaging studies performed. Recent laboratory studies show an elevated serum calcium level of 12.2. Intact PTH level is elevated at 89. 25 hydroxy vitamin D level is low at 16.9. Patient has not had a 24-hour urine collection for calcium. Patient denies any significant complications. He does have some bone and joint discomfort. He has some low back pain. He denies nephrolithiasis. He denies fatigue. Patient has had no prior head or neck surgery.  Imaging studies included an ultrasound, a nuclear medicine parathyroid scan, and a 4D CT scan.  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  Patient now comes to surgery for neck exploration and parathyroidectomy.  Procedure:  The patient was seen in the pre-op holding area. The risks, benefits, complications, treatment options, and expected outcomes were previously discussed with the patient. The patient agreed with the proposed plan and has signed the informed consent form.  The patient was brought to the operating room by the surgical team, identified as Charles Key and the procedure verified. A "time out" was completed and the above information confirmed.  Following administration of general anesthesia, the patient is  positioned and then prepped and draped in usual aseptic fashion.  After ascertaining that an adequate level of anesthesia been achieved, a small Kocher incision is made with a #15 blade.  Dissection is carried through subcutaneous tissues and platysma.  Hemostasis is achieved with the electrocautery.  Skin flaps are elevated cephalad and caudad from the thyroid notch to the sternal notch.  Self-retaining retractors were placed for exposure.  Strap muscles are incised in the midline.  Initial dissection is to the left.  Strap muscles are reflected laterally exposing a normal left thyroid lobe.  Left lobe was gently mobilized.  Venous tributaries are divided between small ligaclips.  Gland is mobilized anteriorly.  At the superior pole of the thyroid located just posteriorly along the edge of the hypopharynx is a nodular mass which appears to be an enlarged parathyroid gland with a small portion of adherent adipose tissue.  It is gently dissected out.  Vascular structures are divided between small ligaclips.  The gland is excised and submitted to pathology for review.  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  Next we explored the left inferior position.  No obvious enlarged parathyroid tissue was identified in the normal anatomic locations.  Strap muscles on the right were then reflected laterally exposing a normal right thyroid lobe.  Again the right superior and right inferior positions were explored.  There appeared to be a small amount of normal parathyroid tissue in the right inferior position.  No enlarged parathyroid tissue was identified.  Neck was irrigated with warm saline.  Good hemostasis was obtained throughout the operative field.  Fibrillar was placed throughout the operative field.  Strap muscles  are reapproximated in the midline with interrupted 3-0 Vicryl sutures.  Platysma is closed with interrupted 3-0 Vicryl sutures.  Skin is anesthetized  with local anesthetic.  Skin edges are reapproximated with a running 4-0 Monocryl subcuticular suture.  Wound is washed and dried and Dermabond is applied as dressing.  Patient is awakened from anesthesia and transported to the recovery room in stable condition.  The patient tolerated the procedure well.   Armandina Gemma, [place] [facility] Office: [phone]

## 2022-04-02 NOTE — Transfer of Care (Signed)
Immediate Anesthesia Transfer of Care Note  Patient: Charles Key  Procedure(s) Performed: NECK EXPLORATION WITH PARATHYROIDECTOMY  Patient Location: PACU  Anesthesia Type:General  Level of Consciousness: awake, alert , oriented and patient cooperative  Airway & Oxygen Therapy: Patient Spontanous Breathing and Patient connected to face mask oxygen  Post-op Assessment: Report given to RN, Post -op Vital signs reviewed and stable and Patient moving all extremities X 4  Post vital signs: Reviewed and stable  Last Vitals:  Vitals Value Taken Time  BP 153/80 04/02/22 0908  Temp    Pulse 100 04/02/22 0909  Resp 19 04/02/22 0909  SpO2 92 % 04/02/22 0909  Vitals shown include unvalidated device data.  Last Pain:  Vitals:   04/02/22 0601  TempSrc: Oral  PainSc:          Complications: No notable events documented.

## 2022-04-02 NOTE — Anesthesia Preprocedure Evaluation (Addendum)
Anesthesia Evaluation  Patient identified by MRN, date of birth, ID band Patient awake    Reviewed: Allergy & Precautions, NPO status , Patient's Chart, lab work & pertinent test results  Airway Mallampati: I  TM Distance: >3 FB Neck ROM: Full    Dental  (+) Poor Dentition, Missing, Chipped, Dental Advisory Given,    Pulmonary    breath sounds clear to auscultation       Cardiovascular negative cardio ROS   Rhythm:Regular Rate:Normal     Neuro/Psych  Headaches, PSYCHIATRIC DISORDERS Anxiety Depression    GI/Hepatic negative GI ROS, Neg liver ROS,   Endo/Other  negative endocrine ROS  Renal/GU negative Renal ROS     Musculoskeletal negative musculoskeletal ROS (+)   Abdominal Normal abdominal exam  (+)   Peds  Hematology negative hematology ROS (+)   Anesthesia Other Findings   Reproductive/Obstetrics                            Anesthesia Physical Anesthesia Plan  ASA: 2  Anesthesia Plan: General   Post-op Pain Management:    Induction: Intravenous  PONV Risk Score and Plan: 3 and Ondansetron, Dexamethasone and Midazolam  Airway Management Planned: Oral ETT  Additional Equipment: None  Intra-op Plan:   Post-operative Plan: Extubation in OR  Informed Consent: I have reviewed the patients History and Physical, chart, labs and discussed the procedure including the risks, benefits and alternatives for the proposed anesthesia with the patient or authorized representative who has indicated his/her understanding and acceptance.     Dental advisory given  Plan Discussed with: CRNA  Anesthesia Plan Comments:        Anesthesia Quick Evaluation

## 2022-04-03 ENCOUNTER — Encounter (HOSPITAL_COMMUNITY): Payer: Self-pay | Admitting: Surgery

## 2022-04-03 LAB — SURGICAL PATHOLOGY

## 2022-06-01 ENCOUNTER — Ambulatory Visit (INDEPENDENT_AMBULATORY_CARE_PROVIDER_SITE_OTHER): Payer: 59 | Admitting: Family Medicine

## 2022-06-01 ENCOUNTER — Encounter: Payer: Self-pay | Admitting: Family Medicine

## 2022-06-01 DIAGNOSIS — R6882 Decreased libido: Secondary | ICD-10-CM

## 2022-06-01 DIAGNOSIS — F32A Depression, unspecified: Secondary | ICD-10-CM

## 2022-06-01 DIAGNOSIS — F419 Anxiety disorder, unspecified: Secondary | ICD-10-CM

## 2022-06-01 DIAGNOSIS — E782 Mixed hyperlipidemia: Secondary | ICD-10-CM | POA: Diagnosis not present

## 2022-06-01 DIAGNOSIS — E210 Primary hyperparathyroidism: Secondary | ICD-10-CM | POA: Diagnosis not present

## 2022-06-01 MED ORDER — BUPROPION HCL ER (XL) 150 MG PO TB24: 150.0000 mg | ORAL_TABLET | Freq: Every day | ORAL | 1 refills | Status: DC

## 2022-06-01 MED ORDER — DULOXETINE HCL 60 MG PO CPEP: 120.0000 mg | ORAL_CAPSULE | Freq: Every day | ORAL | 3 refills | Status: DC

## 2022-06-01 NOTE — Patient Instructions (Signed)
Medication as prescribed.  Labs when you can.  Follow up in 6 months.

## 2022-06-03 DIAGNOSIS — F419 Anxiety disorder, unspecified: Secondary | ICD-10-CM | POA: Insufficient documentation

## 2022-06-03 DIAGNOSIS — R6882 Decreased libido: Secondary | ICD-10-CM | POA: Insufficient documentation

## 2022-06-03 NOTE — Assessment & Plan Note (Signed)
Stable. Continue Cymbalta and Buspar.

## 2022-06-03 NOTE — Assessment & Plan Note (Addendum)
Likely from patient's medication (Cymbalta). Adding Wellbutrin today.  Obtaining labs to assess for hypogonadism.

## 2022-06-03 NOTE — Progress Notes (Signed)
Subjective:  Patient ID: Charles Key, male    DOB: 12-30-1990  Age: 31 y.o. MRN: [id number]  CC: Chief Complaint  Patient presents with   Depression    Pt arrives for follow up. Pt has very low sex drive; pt husband has a very high sex drive.     HPI:  31 year old male with depression, anxiety, hyperparathyroidism (s/p Parathyroidectomy) presents for follow up.  Patient reports that his mood is stable on Buspar and Cymbalta. PHQ-9 score of 5 today.   He does report, however, that he has a diminished sex drive. Little interest in sex. This is causing issues in his relationship. He would like to discuss this today.   Patient Active Problem List   Diagnosis Date Noted   Low libido 06/03/2022   Anxiety and depression 06/03/2022   Hyperparathyroidism, primary ([place]) 04/01/2022   Vitamin D deficiency 10/27/2019   History of suicidal ideation 03/23/2015   History of intentional self-harm 03/23/2015    Social Hx   Social History   Socioeconomic History   Marital status: Married    Spouse name: Not on file   Number of children: Not on file   Years of education: Not on file   Highest education level: Not on file  Occupational History   Not on file  Tobacco Use   Smoking status: Never   Smokeless tobacco: Never  Vaping Use   Vaping Use: Never used  Substance and Sexual Activity   Alcohol use: Not on file    Comment: rarely   Drug use: Not Currently   Sexual activity: Yes  Other Topics Concern   Not on file  Social History Narrative   Not on file   Social Determinants of Health   Financial Resource Strain: Not on file  Food Insecurity: Not on file  Transportation Needs: Not on file  Physical Activity: Not on file  Stress: Not on file  Social Connections: Not on file    Review of Systems Per HPI  Objective:  BP 138/64   Pulse (!) 103   Temp (!) 97.3 F (36.3 C)   Wt 185 lb 9.6 oz (84.2 kg)   SpO2 98%   BMI 26.63 kg/m      06/01/2022   10:49  AM 04/02/2022   10:15 AM 04/02/2022   10:11 AM  BP/Weight  Systolic BP 993 716 967  Diastolic BP 64 893 810  Wt. (Lbs) 185.6    BMI 26.63 kg/m2      Physical Exam Vitals and nursing note reviewed.  Constitutional:      General: He is not in acute distress.    Appearance: Normal appearance.  HENT:     Head: Normocephalic and atraumatic.  Cardiovascular:     Rate and Rhythm: Normal rate and regular rhythm.  Pulmonary:     Effort: Pulmonary effort is normal.     Breath sounds: Normal breath sounds. No wheezing, rhonchi or rales.  Neurological:     Mental Status: He is alert.  Psychiatric:        Mood and Affect: Mood normal.        Behavior: Behavior normal.     Lab Results  Component Value Date   WBC 7.8 11/24/2021   HGB 15.8 11/24/2021   HCT 48.1 11/24/2021   PLT 244 11/24/2021   GLUCOSE 74 11/24/2021   CHOL 282 (H) 11/24/2021   TRIG 160 (H) 11/24/2021   HDL 48 11/24/2021   LDLCALC 204 (H)  11/24/2021   ALT 69 (H) 11/24/2021   AST 28 11/24/2021   NA 149 (H) 11/24/2021   K 4.5 11/24/2021   CL 109 (H) 11/24/2021   CREATININE 1.00 11/24/2021   BUN 21 (H) 11/24/2021   CO2 27 11/24/2021     Assessment & Plan:   Problem List Items Addressed This Visit       Endocrine   Hyperparathyroidism, primary ([place])   Relevant Orders   CMP14+EGFR     Other   Low libido    Likely from patient's medication (Cymbalta). Adding Wellbutrin today.  Obtaining labs to assess for hypogonadism.      Relevant Orders   Testosterone Total,Free,Bio, Males   Anxiety and depression    Stable. Continue Cymbalta and Buspar.      Relevant Medications   DULoxetine (CYMBALTA) 60 MG capsule   buPROPion (WELLBUTRIN XL) 150 MG 24 hr tablet   Other Visit Diagnoses     Mixed hyperlipidemia       Relevant Orders   Lipid panel       Meds ordered this encounter  Medications   DULoxetine (CYMBALTA) 60 MG capsule    Sig: Take 2 capsules (120 mg total) by mouth daily.    Dispense:   180 capsule    Refill:  3   buPROPion (WELLBUTRIN XL) 150 MG 24 hr tablet    Sig: Take 1 tablet (150 mg total) by mouth daily.    Dispense:  90 tablet    Refill:  1    Follow-up: 6 months   [place]

## 2023-02-07 ENCOUNTER — Other Ambulatory Visit: Payer: Self-pay | Admitting: Family Medicine

## 2023-02-07 NOTE — Telephone Encounter (Signed)
Left message for patient to return the call for additional details and recommendations.  Pt needs to schedule appt

## 2023-03-12 ENCOUNTER — Ambulatory Visit (INDEPENDENT_AMBULATORY_CARE_PROVIDER_SITE_OTHER): Payer: 59 | Admitting: Family Medicine

## 2023-03-12 VITALS — BP 121/74 | HR 91 | Temp 98.1°F | Ht 70.0 in | Wt 175.2 lb

## 2023-03-12 DIAGNOSIS — L989 Disorder of the skin and subcutaneous tissue, unspecified: Secondary | ICD-10-CM | POA: Diagnosis not present

## 2023-03-12 MED ORDER — DESONIDE 0.05 % EX CREA: TOPICAL_CREAM | Freq: Two times a day (BID) | CUTANEOUS | 0 refills | Status: DC | PRN

## 2023-03-12 NOTE — Progress Notes (Signed)
Subjective:  Patient ID: Charles Key, male    DOB: 05-Nov-1990  Age: 32 y.o. MRN: [id number]  CC: Chief Complaint  Patient presents with   Mass    Pt comes in today for a spot on the right side of his nose, pt states this spot has been there for over a month but now worries him as it is starting to grow in size. Sensitive to the touch.    HPI:  32 year old male presents for evaluation of the above.  Patient reports that he has an area of concern to the medial aspect of the nose just below the eye.  He states that the area is red and often dry.  States that it seems to be getting larger.  It is not raised.  Has been sensitive to touch.  He states that it has been changing over the past month with increase in size.  No dark discoloration.  Patient Active Problem List   Diagnosis Date Noted   Skin lesion 03/12/2023   Low libido 06/03/2022   Anxiety and depression 06/03/2022   Hyperparathyroidism, primary ([facility]) 04/01/2022   Vitamin D deficiency 10/27/2019   History of suicidal ideation 03/23/2015   History of intentional self-harm 03/23/2015    Social Hx   Social History   Socioeconomic History   Marital status: Married    Spouse name: Not on file   Number of children: Not on file   Years of education: Not on file   Highest education level: Bachelor's degree (e.g., BA, AB, BS)  Occupational History   Not on file  Tobacco Use   Smoking status: Never   Smokeless tobacco: Never  Vaping Use   Vaping status: Never Used  Substance and Sexual Activity   Alcohol use: Not on file    Comment: rarely   Drug use: Not Currently   Sexual activity: Yes  Other Topics Concern   Not on file  Social History Narrative   Not on file   Social Determinants of Health   Financial Resource Strain: Medium Risk (03/12/2023)   Overall Financial Resource Strain (CARDIA)    Difficulty of Paying Living Expenses: Somewhat hard  Food Insecurity: No Food Insecurity (03/12/2023)   Hunger  Vital Sign    Worried About Running Out of Food in the Last Year: Never true    Ran Out of Food in the Last Year: Never true  Transportation Needs: No Transportation Needs (03/12/2023)   PRAPARE - Administrator, Civil Service (Medical): No    Lack of Transportation (Non-Medical): No  Physical Activity: Insufficiently Active (03/12/2023)   Exercise Vital Sign    Days of Exercise per Week: 2 days    Minutes of Exercise per Session: 20 min  Stress: Stress Concern Present (03/12/2023)   [employer] of Occupational Health - Occupational Stress Questionnaire    Feeling of Stress : Rather much  Social Connections: Unknown (03/12/2023)   Social Connection and Isolation Panel [NHANES]    Frequency of Communication with Friends and Family: Twice a week    Frequency of Social Gatherings with Friends and Family: Patient declined    Attends Religious Services: Never    Database administrator or Organizations: No    Attends Engineer, structural: Not on file    Marital Status: Separated    Review of Systems Per HPI  Objective:  BP 121/74   Pulse 91   Temp 98.1 F (36.7 C)   Ht 5\' 10"  (  1.778 m)   Wt 175 lb 3.2 oz (79.5 kg)   SpO2 97%   BMI 25.14 kg/m      03/12/2023   11:07 AM 06/01/2022   10:49 AM 04/02/2022   10:15 AM  BP/Weight  Systolic BP 121 138 154  Diastolic BP 74 64 101  Wt. (Lbs) 175.2 185.6   BMI 25.14 kg/m2 26.63 kg/m2     Physical Exam Constitutional:      General: He is not in acute distress.    Appearance: Normal appearance.  HENT:     Head: Normocephalic and atraumatic.      Comments: Labeled location with a small area flat area of erythema.  No dark discoloration.  No appreciable hypervascularity.  No scaling. Neurological:     Mental Status: He is alert.     Lab Results  Component Value Date   WBC 7.8 11/24/2021   HGB 15.8 11/24/2021   HCT 48.1 11/24/2021   PLT 244 11/24/2021   GLUCOSE 74 11/24/2021   CHOL 282 (H) 11/24/2021    TRIG 160 (H) 11/24/2021   HDL 48 11/24/2021   LDLCALC 204 (H) 11/24/2021   ALT 69 (H) 11/24/2021   AST 28 11/24/2021   NA 149 (H) 11/24/2021   K 4.5 11/24/2021   CL 109 (H) 11/24/2021   CREATININE 1.00 11/24/2021   BUN 21 (H) 11/24/2021   CO2 27 11/24/2021     Assessment & Plan:   Problem List Items Addressed This Visit       Musculoskeletal and Integument   Skin lesion - Primary    Suspect benign lesion.  Given the fact that he states that it has been dry, I have sent in topical desonide.  Advised to avoid the eye.  Referring to dermatology      Relevant Orders   Ambulatory referral to Dermatology    Meds ordered this encounter  Medications   desonide (DESOWEN) 0.05 % cream    Sig: Apply topically 2 (two) times daily as needed.    Dispense:  30 g    Refill:  0    Follow-up:  Return if symptoms worsen or fail to improve.  Everlene Other DO [facility]

## 2023-03-12 NOTE — Assessment & Plan Note (Signed)
Suspect benign lesion.  Given the fact that he states that it has been dry, I have sent in topical desonide.  Advised to avoid the eye.  Referring to dermatology

## 2023-03-12 NOTE — Patient Instructions (Signed)
Medication as directed.  Referral to dermatology placed.

## 2023-08-02 ENCOUNTER — Other Ambulatory Visit: Payer: Self-pay | Admitting: Family Medicine

## 2023-10-29 IMAGING — NM NM PARATHYROID W/ SPECT
3 series · 8 of 8 positions shown · non-contrast
Comparison: Thyroid ultrasound 01/26/2022

CLINICAL DATA: Primary hyperparathyroidism, hypercalcemia and
elevated parathormone

EXAM:
NM PARATHYROID SCINTIGRAPHY AND SPECT IMAGING
TECHNIQUE: Following intravenous administration of radiopharmaceutical, early
and 2-hour delayed planar images were obtained in the anterior
projection. Delayed triplanar SPECT images were also obtained at 2
hours.
RADIOPHARMACEUTICALS:  25.1 mCi Hc-VVm Sestamibi IV

[Series 1: 15 min ant · 4.14mm/px · 1 of 1 slices shown]
[im 1/1]
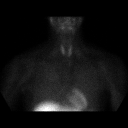

[Series 2: 2 hr ant · 4.14mm/px · 1 of 1 slices shown]
[im 1/1]
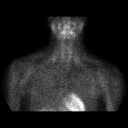

[Series 3: spect parathyroid · 4.14mm/px · 6 of 64 frames shown]
[frame 6/64]
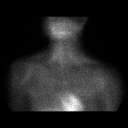
[frame 16/64]
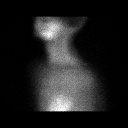
[frame 27/64]
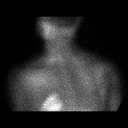
[frame 38/64]
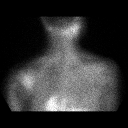
[frame 48/64]
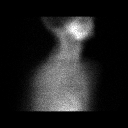
[frame 59/64]
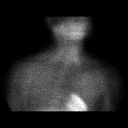

[8 of 8 positions shown; findings below may reference images not displayed]

FINDINGS: Planar imaging: Initial image demonstrates homogeneous tracer
localization in the thyroid lobes. Delayed image demonstrates
washout of tracer from thyroid lobes without focal abnormal
sestamibi retention.

SPECT imaging: Homogeneous residual tracer uptake in the thyroid
lobes. Questionable focus of abnormal tracer retention at the upper
pole of the LEFT thyroid lobe, cannot exclude subtle LEFT superior
parathyroid adenoma. No additional sites of worrisome tracer
retention identified.
IMPRESSION: Questionable focus of abnormal tracer retention at superior LEFT
thyroid lobe, cannot exclude subtle LEFT superior parathyroid
adenoma.

## 2023-10-29 IMAGING — US US THYROID
1 series · 14 of 25 positions shown · non-contrast
Comparison: Nuclear medicine parathyroid scan 01/26/2022

CLINICAL DATA: Primary hyperparathyroidism

EXAM:
THYROID ULTRASOUND
TECHNIQUE: Ultrasound examination of the thyroid gland and adjacent soft
tissues was performed.

[Series 1: us thyroid · 34 acquisitions, 14 frames shown]
[im 1/34]
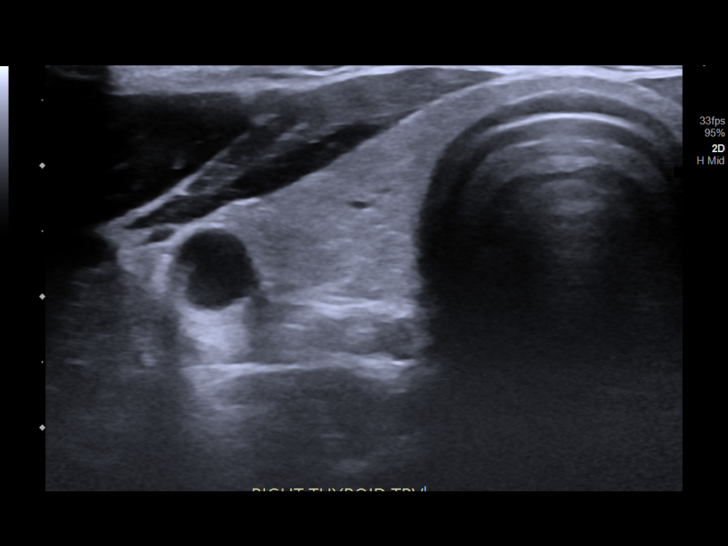
[im 3/34]
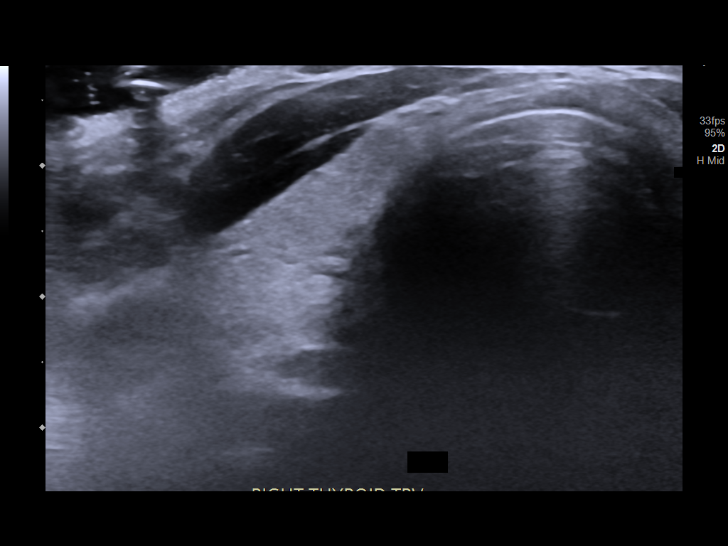
[im 6/34]
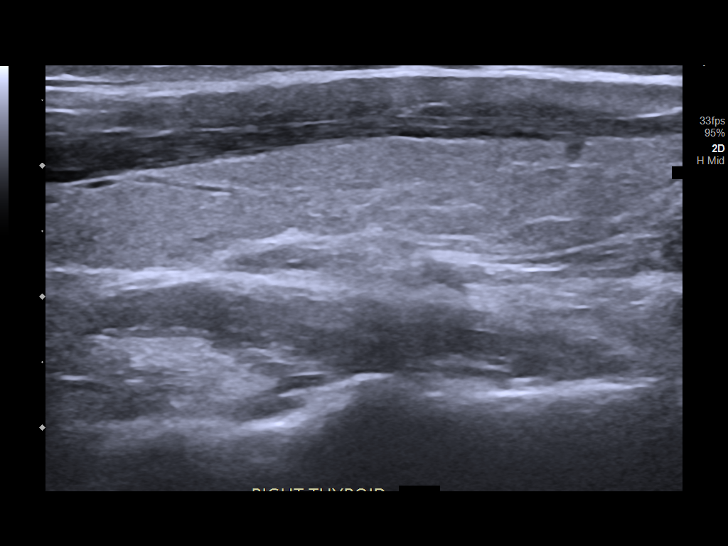
[im 9/34]
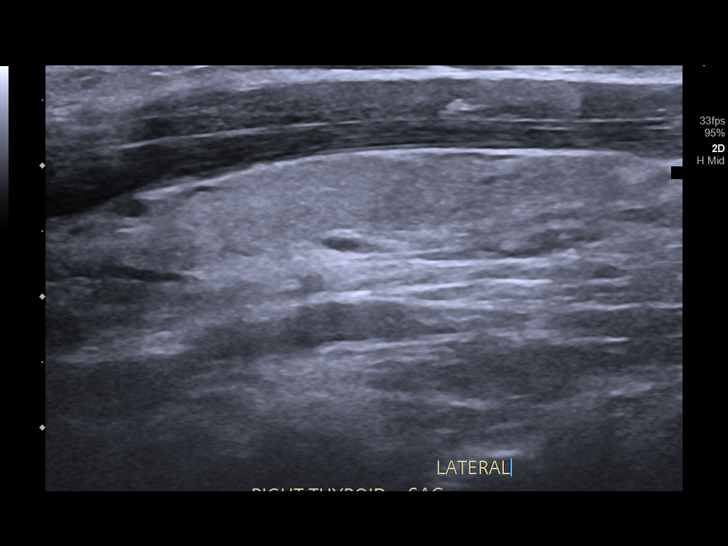
[im 12/34]
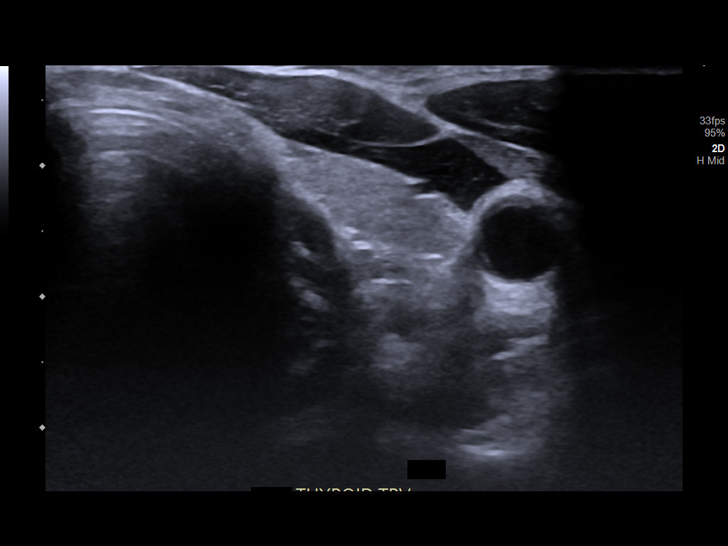
[im 13/34]
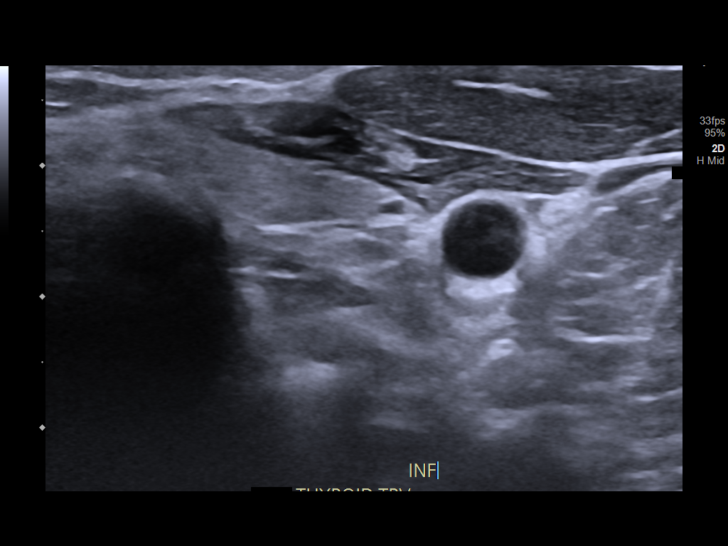
[im 16/34]
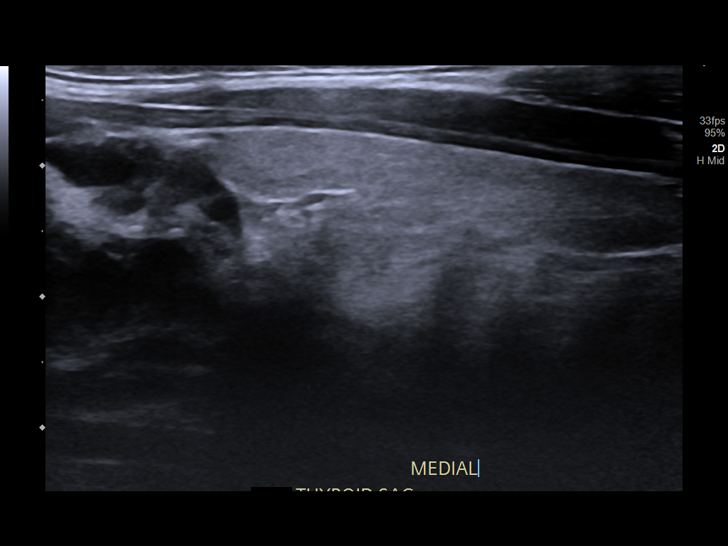
[im 18/34]
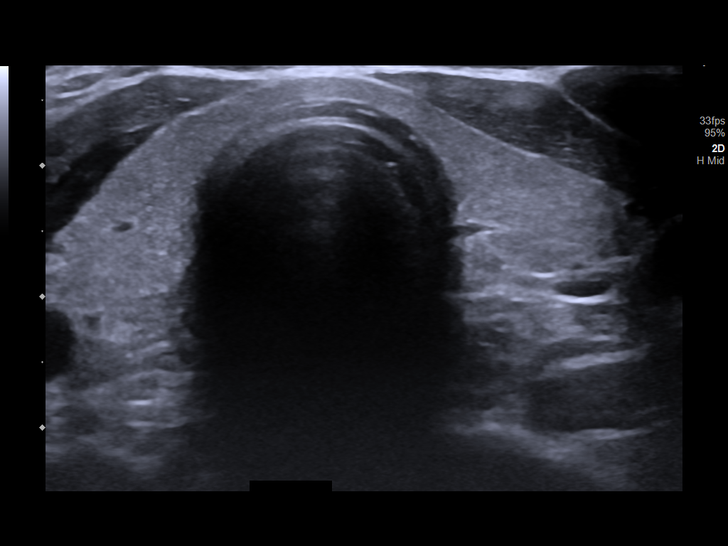
[im 21/34]
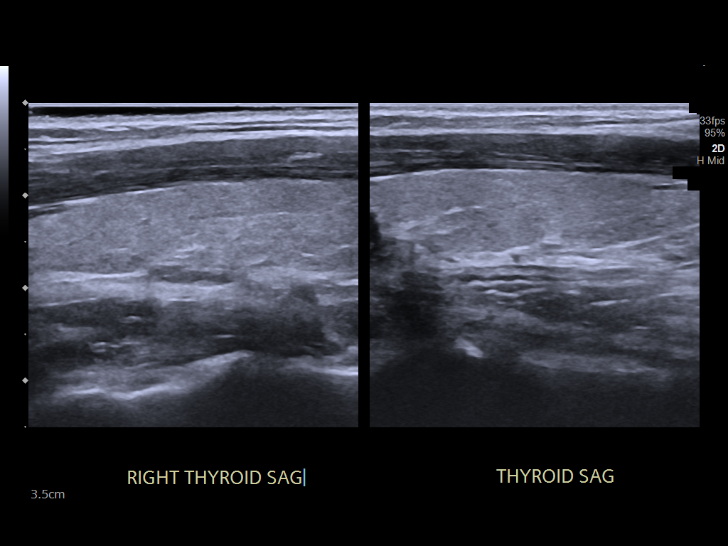
[im 23/34]
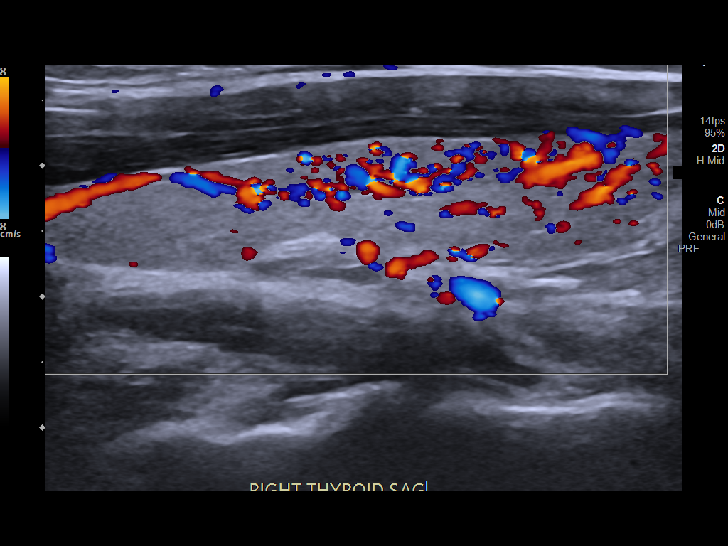
[im 25/34]
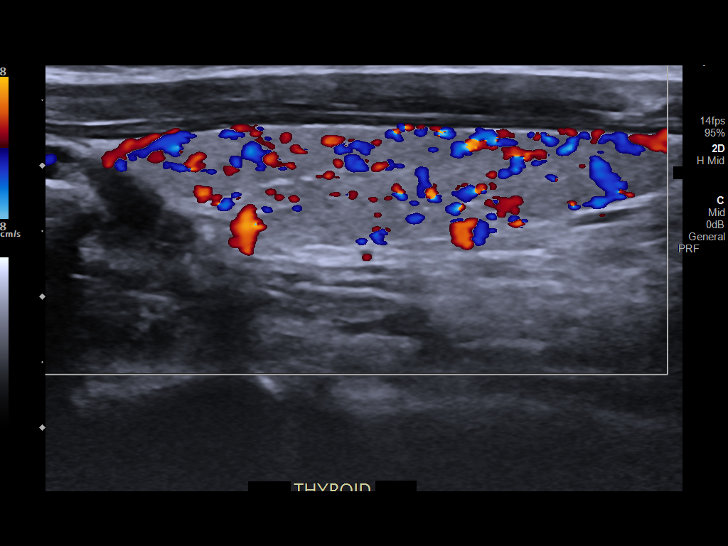
[im 28/34]
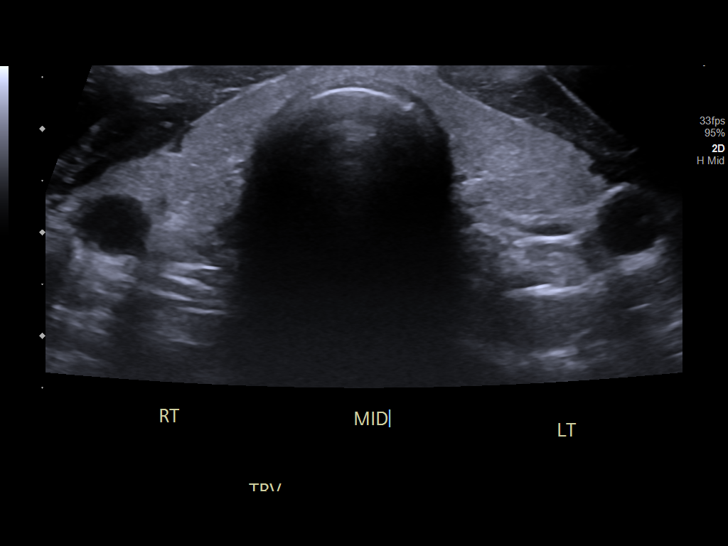
[im 31/34]
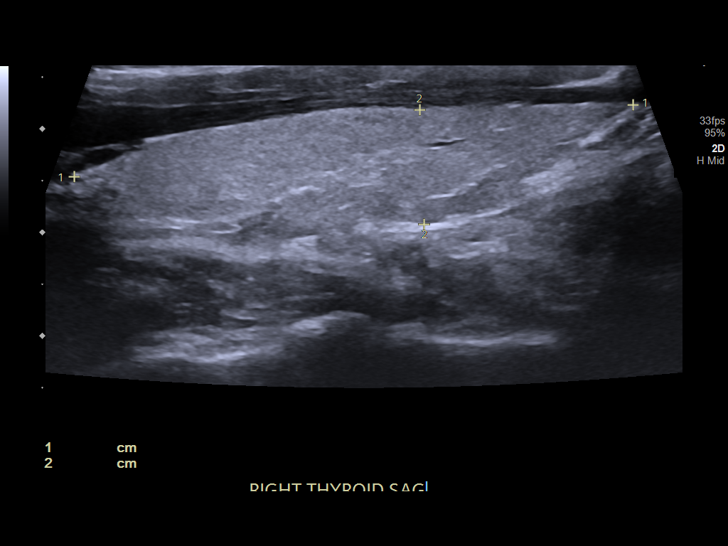
[im 34/34]
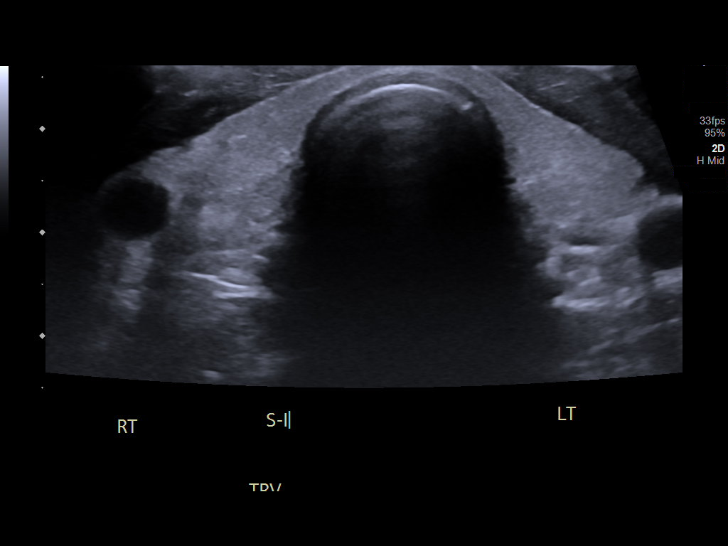

[14 of 25 positions shown; findings below may reference images not displayed]

FINDINGS: Parenchymal Echotexture: Normal

Isthmus: 2 mm

Right lobe: 5.4 x 1.1 x 1.7 cm

Left lobe: 4.6 x 1.1 x 1.5 cm

_________________________________________________________

Estimated total number of nodules >/= 1 cm: 0

Number of spongiform nodules >/=  2 cm not described below (TR1): 0

Number of mixed cystic and solid nodules >/= 1.5 cm not described
below (TR2): 0

_________________________________________________________

No discrete nodules are seen within the thyroid gland. Thyroid gland
appears within normal limits for age. No regional adenopathy. No
enlarged parathyroid gland posterior to the thyroid.
IMPRESSION: Normal thyroid ultrasound.

The above is in keeping with the ACR TI-RADS recommendations - [HOSPITAL] 6581;[DATE].

## 2024-01-29 ENCOUNTER — Other Ambulatory Visit: Payer: Self-pay | Admitting: Family Medicine

## 2024-01-29 MED ORDER — BUSPIRONE HCL 15 MG PO TABS: 15.0000 mg | ORAL_TABLET | Freq: Two times a day (BID) | ORAL | 0 refills | Status: DC

## 2024-01-29 MED ORDER — BUPROPION HCL ER (XL) 150 MG PO TB24: 150.0000 mg | ORAL_TABLET | Freq: Every day | ORAL | 1 refills | Status: DC

## 2024-01-29 NOTE — Telephone Encounter (Signed)
 Copied from CRM [id number]. Topic: Clinical - Medication Refill >> Jan 29, 2024  9:49 AM Hobson Luna F wrote: Medication: busPIRone  (BUSPAR ) 15 MG tablet [[id number]], buPROPion  (WELLBUTRIN  XL) 150 MG 24 hr tablet [[id number]]  Has the patient contacted their pharmacy? Yes  (Agent: If yes, when and what did the pharmacy advise?) call office  This is the patient's preferred pharmacy:  [id number] - [place], [place] - [address] AT [facility] OF [address]   Is this the correct pharmacy for this prescription? Yes If no, delete pharmacy and type the correct one.   Has the prescription been filled recently? Yes  Is the patient out of the medication? No  Has the patient been seen for an appointment in the last year OR does the patient have an upcoming appointment? Yes  Can we respond through MyChart? Yes  Agent: Please be advised that Rx refills may take up to 3 business days. We ask that you follow-up with your pharmacy.

## 2024-01-30 ENCOUNTER — Other Ambulatory Visit: Payer: Self-pay | Admitting: Family Medicine

## 2024-01-30 MED ORDER — DULOXETINE HCL 60 MG PO CPEP: 120.0000 mg | ORAL_CAPSULE | Freq: Every day | ORAL | 0 refills | Status: DC

## 2024-01-30 NOTE — Telephone Encounter (Signed)
 Copied from CRM [id number]. Topic: Clinical - Medication Refill >> Jan 30, 2024 12:58 PM Elle L wrote: Medication: DULoxetine  (CYMBALTA ) 60 MG capsule  Has the patient contacted their pharmacy? Yes  This is the patient's preferred pharmacy:   [id number] [place], [place] - [address] AT [facility] OF [facility] [address] & [facility] [address] [place] [address] Phone: [phone] Fax: [phone]  Is this the correct pharmacy for this prescription? Yes If no, delete pharmacy and type the correct one.   Has the prescription been filled recently? No  Is the patient out of the medication? Yes  Has the patient been seen for an appointment in the last year OR does the patient have an upcoming appointment? Yes  Can we respond through MyChart? Yes  Agent: Please be advised that Rx refills may take up to 3 business days. We ask that you follow-up with your pharmacy.

## 2024-04-27 ENCOUNTER — Other Ambulatory Visit: Payer: Self-pay | Admitting: Family Medicine

## 2024-05-01 ENCOUNTER — Ambulatory Visit: Admitting: Family Medicine

## 2024-05-01 VITALS — BP 118/82 | HR 83 | Temp 96.6°F | Ht 70.0 in | Wt 175.0 lb

## 2024-05-01 DIAGNOSIS — F32A Depression, unspecified: Secondary | ICD-10-CM

## 2024-05-01 DIAGNOSIS — Z130 Encounter for screening for diseases of the blood and blood-forming organs and certain disorders involving the immune mechanism: Secondary | ICD-10-CM | POA: Diagnosis not present

## 2024-05-01 DIAGNOSIS — Z8639 Personal history of other endocrine, nutritional and metabolic disease: Secondary | ICD-10-CM

## 2024-05-01 DIAGNOSIS — R509 Fever, unspecified: Secondary | ICD-10-CM | POA: Diagnosis not present

## 2024-05-01 DIAGNOSIS — R070 Pain in throat: Secondary | ICD-10-CM

## 2024-05-01 DIAGNOSIS — E7849 Other hyperlipidemia: Secondary | ICD-10-CM

## 2024-05-01 DIAGNOSIS — F419 Anxiety disorder, unspecified: Secondary | ICD-10-CM

## 2024-05-01 MED ORDER — BUSPIRONE HCL 15 MG PO TABS: 15.0000 mg | ORAL_TABLET | Freq: Two times a day (BID) | ORAL | 3 refills | Status: AC

## 2024-05-01 MED ORDER — BUSPIRONE HCL 15 MG PO TABS: 15.0000 mg | ORAL_TABLET | Freq: Two times a day (BID) | ORAL | 3 refills | Status: DC

## 2024-05-01 MED ORDER — DULOXETINE HCL 60 MG PO CPEP: 120.0000 mg | ORAL_CAPSULE | Freq: Every day | ORAL | 3 refills | Status: DC

## 2024-05-01 MED ORDER — DULOXETINE HCL 60 MG PO CPEP: 120.0000 mg | ORAL_CAPSULE | Freq: Every day | ORAL | 3 refills | Status: AC

## 2024-05-01 NOTE — Patient Instructions (Addendum)
 Rest. Fluids.  Tylenol  and/or Ibuprofen as needed.  Meds refilled.  Follow up in ~ 6 months.

## 2024-05-03 DIAGNOSIS — R509 Fever, unspecified: Secondary | ICD-10-CM | POA: Insufficient documentation

## 2024-05-03 LAB — COVID-19, FLU A+B AND RSV
Influenza A, NAA: NOT DETECTED
Influenza B, NAA: NOT DETECTED
RSV, NAA: NOT DETECTED
SARS-CoV-2, NAA: NOT DETECTED

## 2024-05-03 LAB — POCT RAPID STREP A (OFFICE): Rapid Strep A Screen: NEGATIVE

## 2024-05-03 NOTE — Assessment & Plan Note (Signed)
 Stable. Buspar  and Cymbalta  refilled.

## 2024-05-03 NOTE — Progress Notes (Signed)
 Subjective:  Patient ID: Charles Key, male    DOB: [birth year]  Age: 33 y.o. MRN: [id number]  CC:  Fever   HPI:  33 year old male presents for evaluation of the above.  Visit initially scheduled as a follow up visit. However, he is currently sick.  Patient reports fever x 3 days. Tmax 102. Reports associated body aches, and chills. Also reports sore throat. No other respiratory symptoms.   Needs refills on medications for anxiety and depression.  Patient Active Problem List   Diagnosis Date Noted   Fever 05/03/2024   History of primary hyperparathyroidism 05/01/2024   Low libido 06/03/2022   Anxiety and depression 06/03/2022   Vitamin D  deficiency 10/27/2019   History of suicidal ideation 03/23/2015    Social Hx   Social History   Socioeconomic History   Marital status: Married    Spouse name: Not on file   Number of children: Not on file   Years of education: Not on file   Highest education level: Bachelor's degree (e.g., BA, AB, BS)  Occupational History   Not on file  Tobacco Use   Smoking status: Never   Smokeless tobacco: Never  Vaping Use   Vaping status: Never Used  Substance and Sexual Activity   Alcohol use: Not on file    Comment: rarely   Drug use: Not Currently   Sexual activity: Yes  Other Topics Concern   Not on file  Social History Narrative   Not on file   Social Drivers of Health   Financial Resource Strain: Medium Risk (03/12/2023)   Overall Financial Resource Strain (CARDIA)    Difficulty of Paying Living Expenses: Somewhat hard  Food Insecurity: No Food Insecurity (03/12/2023)   Hunger Vital Sign    Worried About Running Out of Food in the Last Year: Never true    Ran Out of Food in the Last Year: Never true  Transportation Needs: No Transportation Needs (03/12/2023)   PRAPARE - Administrator, Civil Service (Medical): No    Lack of Transportation (Non-Medical): No  Physical Activity: Insufficiently Active  (03/12/2023)   Exercise Vital Sign    Days of Exercise per Week: 2 days    Minutes of Exercise per Session: 20 min  Stress: Stress Concern Present (03/12/2023)   [employer] of Occupational Health - Occupational Stress Questionnaire    Feeling of Stress : Rather much  Social Connections: Unknown (03/12/2023)   Social Connection and Isolation Panel    Frequency of Communication with Friends and Family: Twice a week    Frequency of Social Gatherings with Friends and Family: Patient declined    Attends Religious Services: Never    Database administrator or Organizations: No    Attends Engineer, structural: Not on file    Marital Status: Separated    Review of Systems Per HPI  Objective:  BP 118/82   Pulse 83   Temp (!) 96.6 F (35.9 C)   Ht 5' 10 (1.778 m)   Wt 175 lb (79.4 kg)   SpO2 97%   BMI 25.11 kg/m      05/01/2024    9:42 AM 03/12/2023   11:07 AM 06/01/2022   10:49 AM  BP/Weight  Systolic BP 118 121 138  Diastolic BP 82 74 64  Wt. (Lbs) 175 175.2 185.6  BMI 25.11 kg/m2 25.14 kg/m2 26.63 kg/m2    Physical Exam Constitutional:      Comments: Sweating.  HENT:  Head: Normocephalic and atraumatic.     Right Ear: Tympanic membrane normal.     Left Ear: Tympanic membrane normal.     Nose: Nose normal.     Mouth/Throat:     Pharynx: Posterior oropharyngeal erythema present.  Cardiovascular:     Rate and Rhythm: Normal rate and regular rhythm.  Pulmonary:     Effort: Pulmonary effort is normal.     Breath sounds: Normal breath sounds. No wheezing or rales.  Neurological:     Mental Status: He is alert.     Lab Results  Component Value Date   WBC 7.8 11/24/2021   HGB 15.8 11/24/2021   HCT 48.1 11/24/2021   PLT 244 11/24/2021   GLUCOSE 74 11/24/2021   CHOL 282 (H) 11/24/2021   TRIG 160 (H) 11/24/2021   HDL 48 11/24/2021   LDLCALC 204 (H) 11/24/2021   ALT 69 (H) 11/24/2021   AST 28 11/24/2021   NA 149 (H) 11/24/2021   K 4.5 11/24/2021    CL 109 (H) 11/24/2021   CREATININE 1.00 11/24/2021   BUN 21 (H) 11/24/2021   CO2 27 11/24/2021     Assessment & Plan:  Fever, unspecified fever cause Assessment & Plan: Acute febrile illness. Rapid strep negative. Awaiting COVID/Flu/RSV testing. Supportive care.  Orders: -     POCT rapid strep A -     Culture, Group A Strep -     COVID-19, Flu A+B and RSV -     Specimen status report  History of primary hyperparathyroidism -     CMP14+EGFR  Familial hyperlipidemia -     Lipid panel  Screening for deficiency anemia -     CBC  Anxiety and depression Assessment & Plan: Stable. Buspar  and Cymbalta  refilled.  Orders: -     busPIRone  HCl; Take 1 tablet (15 mg total) by mouth 2 (two) times daily.  Dispense: 180 tablet; Refill: 3 -     DULoxetine  HCl; Take 2 capsules (120 mg total) by mouth daily.  Dispense: 180 capsule; Refill: 3    Follow-up:  Has physical scheduled.  Jacqulyn Ahle DO [facility]

## 2024-05-03 NOTE — Assessment & Plan Note (Signed)
 Acute febrile illness. Rapid strep negative. Awaiting COVID/Flu/RSV testing. Supportive care.

## 2024-05-04 ENCOUNTER — Encounter: Payer: Self-pay | Admitting: Family Medicine

## 2024-05-04 ENCOUNTER — Ambulatory Visit (INDEPENDENT_AMBULATORY_CARE_PROVIDER_SITE_OTHER): Admitting: Family Medicine

## 2024-05-04 VITALS — BP 118/71 | HR 96 | Ht 70.0 in | Wt 175.0 lb

## 2024-05-04 DIAGNOSIS — Z0001 Encounter for general adult medical examination with abnormal findings: Secondary | ICD-10-CM | POA: Diagnosis not present

## 2024-05-04 DIAGNOSIS — Z8639 Personal history of other endocrine, nutritional and metabolic disease: Secondary | ICD-10-CM

## 2024-05-04 DIAGNOSIS — E7849 Other hyperlipidemia: Secondary | ICD-10-CM | POA: Diagnosis not present

## 2024-05-04 DIAGNOSIS — K1370 Unspecified lesions of oral mucosa: Secondary | ICD-10-CM

## 2024-05-04 DIAGNOSIS — Z0000 Encounter for general adult medical examination without abnormal findings: Secondary | ICD-10-CM | POA: Insufficient documentation

## 2024-05-04 DIAGNOSIS — Z130 Encounter for screening for diseases of the blood and blood-forming organs and certain disorders involving the immune mechanism: Secondary | ICD-10-CM

## 2024-05-04 MED ORDER — VALACYCLOVIR HCL 1 G PO TABS: 1000.0000 mg | ORAL_TABLET | Freq: Two times a day (BID) | ORAL | 0 refills | Status: AC

## 2024-05-04 NOTE — Assessment & Plan Note (Signed)
 Fever has resolved. 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

## 2024-05-04 NOTE — Assessment & Plan Note (Signed)
 Labs ordered.  Preventative health care discussed. Will fill out form once labs return.

## 2024-05-04 NOTE — Patient Instructions (Signed)
Labs today.  Follow up annually.  Take care  Dr. Raylen Tangonan  

## 2024-05-04 NOTE — Progress Notes (Signed)
 Subjective:  Patient ID: Charles Key, male    DOB: [birth year]  Age: 33 y.o. MRN: [id number]  CC:   Chief Complaint  Patient presents with   Annual Exam   Mouth Lesions    Present 2 days    HPI:  33 year old male presents for an annual exam.    Recent illness. Fever resolved but how has several oral lesions/sores. Continues to have sore throat.  Strep culture still pending. COVID/Flu/RSV testing negative.  Given illness, patient declines vaccines today. Needs routine labs. These are required by his work. Needs form filled out for work in regards to physical.  Besides vaccines (Flu, COVID, Tdap), preventative healthcare items are up to date.    Patient Active Problem List   Diagnosis Date Noted   Unspecified lesions of oral mucosa 05/04/2024   Annual physical exam 05/04/2024   History of primary hyperparathyroidism 05/01/2024   Low libido 06/03/2022   Anxiety and depression 06/03/2022   Vitamin D  deficiency 10/27/2019   History of suicidal ideation 03/23/2015    Social Hx   Social History   Socioeconomic History   Marital status: Married    Spouse name: Not on file   Number of children: Not on file   Years of education: Not on file   Highest education level: Bachelor's degree (e.g., BA, AB, BS)  Occupational History   Not on file  Tobacco Use   Smoking status: Never   Smokeless tobacco: Never  Vaping Use   Vaping status: Never Used  Substance and Sexual Activity   Alcohol use: Not on file    Comment: rarely   Drug use: Not Currently   Sexual activity: Yes  Other Topics Concern   Not on file  Social History Narrative   Not on file   Social Drivers of Health   Financial Resource Strain: Medium Risk (03/12/2023)   Overall Financial Resource Strain (CARDIA)    Difficulty of Paying Living Expenses: Somewhat hard  Food Insecurity: No Food Insecurity (03/12/2023)   Hunger Vital Sign    Worried About Running Out of Food in the Last Year: Never true     Ran Out of Food in the Last Year: Never true  Transportation Needs: No Transportation Needs (03/12/2023)   PRAPARE - Administrator, Civil Service (Medical): No    Lack of Transportation (Non-Medical): No  Physical Activity: Insufficiently Active (03/12/2023)   Exercise Vital Sign    Days of Exercise per Week: 2 days    Minutes of Exercise per Session: 20 min  Stress: Stress Concern Present (03/12/2023)   [employer] of Occupational Health - Occupational Stress Questionnaire    Feeling of Stress : Rather much  Social Connections: Unknown (03/12/2023)   Social Connection and Isolation Panel    Frequency of Communication with Friends and Family: Twice a week    Frequency of Social Gatherings with Friends and Family: Patient declined    Attends Religious Services: Never    Database administrator or Organizations: No    Attends Engineer, structural: Not on file    Marital Status: Separated    Review of Systems Per HPI  Objective:  BP 118/71   Pulse 96   Ht 5' 10 (1.778 m)   Wt 175 lb (79.4 kg)   SpO2 100%   BMI 25.11 kg/m      05/04/2024    3:42 PM 05/01/2024    9:42 AM 03/12/2023   11:07 AM  BP/Weight  Systolic BP 118 118 121  Diastolic BP 71 82 74  Wt. (Lbs) 175 175 175.2  BMI 25.11 kg/m2 25.11 kg/m2 25.14 kg/m2    Physical Exam Vitals and nursing note reviewed.  Constitutional:      General: He is not in acute distress.    Appearance: Normal appearance.  HENT:     Head: Normocephalic and atraumatic.     Nose: Nose normal.     Mouth/Throat:     Pharynx: Posterior oropharyngeal erythema present.     Comments: Multiple oral lesions/ulcerations noted. Eyes:     General:        Right eye: No discharge.        Left eye: No discharge.     Conjunctiva/sclera: Conjunctivae normal.  Cardiovascular:     Rate and Rhythm: Normal rate and regular rhythm.  Pulmonary:     Effort: Pulmonary effort is normal.     Breath sounds: Normal breath sounds.  No wheezing, rhonchi or rales.  Abdominal:     General: There is no distension.     Palpations: Abdomen is soft.     Tenderness: There is no abdominal tenderness.  Musculoskeletal:     Cervical back: Neck supple.  Neurological:     Mental Status: He is alert.  Psychiatric:        Mood and Affect: Mood normal.        Behavior: Behavior normal.     Lab Results  Component Value Date   WBC 7.8 11/24/2021   HGB 15.8 11/24/2021   HCT 48.1 11/24/2021   PLT 244 11/24/2021   GLUCOSE 74 11/24/2021   CHOL 282 (H) 11/24/2021   TRIG 160 (H) 11/24/2021   HDL 48 11/24/2021   LDLCALC 204 (H) 11/24/2021   ALT 69 (H) 11/24/2021   AST 28 11/24/2021   NA 149 (H) 11/24/2021   K 4.5 11/24/2021   CL 109 (H) 11/24/2021   CREATININE 1.00 11/24/2021   BUN 21 (H) 11/24/2021   CO2 27 11/24/2021     Assessment & Plan:  Annual physical exam Assessment & Plan: Labs ordered.  Preventative health care discussed. Will fill out form once labs return.   Screening for deficiency anemia -     CBC  History of primary hyperparathyroidism -     CMP14+EGFR  Familial hyperlipidemia -     Lipid panel  Unspecified lesions of oral mucosa Assessment & Plan: Fever has resolved. 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  Orders: -     valACYclovir  HCl; Take 1 tablet (1,000 mg total) by mouth 2 (two) times daily for 10 days.  Dispense: 20 tablet; Refill: 0    Follow-up:  Annually  Jacqulyn Ahle DO [facility]

## 2024-05-05 ENCOUNTER — Ambulatory Visit: Payer: Self-pay | Admitting: Family Medicine

## 2024-05-05 LAB — CBC
Hematocrit: 46.2 % (ref 37.5–51.0)
Hemoglobin: 15.5 g/dL (ref 13.0–17.7)
MCH: 30.4 pg (ref 26.6–33.0)
MCHC: 33.5 g/dL (ref 31.5–35.7)
MCV: 91 fL (ref 79–97)
Platelets: 233 x10E3/uL (ref 150–450)
RBC: 5.1 x10E6/uL (ref 4.14–5.80)
RDW: 12.4 % (ref 11.6–15.4)
WBC: 9 x10E3/uL (ref 3.4–10.8)

## 2024-05-05 LAB — CMP14+EGFR
ALT: 14 IU/L (ref 0–44)
AST: 17 IU/L (ref 0–40)
Albumin: 4.4 g/dL (ref 4.1–5.1)
Alkaline Phosphatase: 64 IU/L (ref 47–123)
BUN/Creatinine Ratio: 17 (ref 9–20)
BUN: 18 mg/dL (ref 6–20)
Bilirubin Total: 0.3 mg/dL (ref 0.0–1.2)
CO2: 25 mmol/L (ref 20–29)
Calcium: 9.6 mg/dL (ref 8.7–10.2)
Chloride: 100 mmol/L (ref 96–106)
Creatinine, Ser: 1.08 mg/dL (ref 0.76–1.27)
Globulin, Total: 3 g/dL (ref 1.5–4.5)
Glucose: 79 mg/dL (ref 70–99)
Potassium: 4 mmol/L (ref 3.5–5.2)
Sodium: 140 mmol/L (ref 134–144)
Total Protein: 7.4 g/dL (ref 6.0–8.5)
eGFR: 94 mL/min/1.73 (ref >59)

## 2024-05-05 LAB — LIPID PANEL
Chol/HDL Ratio: 5.4 ratio — ABNORMAL HIGH (ref 0.0–5.0)
Cholesterol, Total: 161 mg/dL (ref 100–199)
HDL: 30 mg/dL — ABNORMAL LOW (ref >39)
LDL Chol Calc (NIH): 100 mg/dL — ABNORMAL HIGH (ref 0–99)
Triglycerides: 179 mg/dL — ABNORMAL HIGH (ref 0–149)
VLDL Cholesterol Cal: 31 mg/dL (ref 5–40)

## 2024-05-06 ENCOUNTER — Other Ambulatory Visit: Payer: Self-pay

## 2024-05-06 ENCOUNTER — Other Ambulatory Visit: Payer: Self-pay | Admitting: Family Medicine

## 2024-05-06 ENCOUNTER — Ambulatory Visit: Payer: Self-pay | Admitting: Family Medicine

## 2024-05-06 ENCOUNTER — Telehealth: Payer: Self-pay

## 2024-05-06 LAB — SPECIMEN STATUS REPORT

## 2024-05-06 LAB — CULTURE, GROUP A STREP

## 2024-05-06 MED ORDER — AMOXICILLIN 500 MG PO CAPS: 500.0000 mg | ORAL_CAPSULE | Freq: Two times a day (BID) | ORAL | 0 refills | Status: AC

## 2024-05-06 MED ORDER — AMOXICILLIN 500 MG PO CAPS: 500.0000 mg | ORAL_CAPSULE | Freq: Two times a day (BID) | ORAL | 0 refills | Status: DC

## 2024-05-06 NOTE — Telephone Encounter (Signed)
 Copied from CRM #[id number]. Topic: Clinical - Prescription Issue >> May 06, 2024 12:16 PM Antony RAMAN wrote: Reason for CRM: pt called and said the amox was sent to the wrong pharmacy, the correct one is [address], [place], [place] [address]
